# Patient Record
Sex: Female | Born: 1965 | Race: Black or African American | Hispanic: No
Health system: Southern US, Community
[De-identification: ages and names within clinical notes are randomized; demographics above are authoritative.]

## PROBLEM LIST (undated history)

## (undated) DIAGNOSIS — N809 Endometriosis, unspecified: Secondary | ICD-10-CM

## (undated) DIAGNOSIS — J45909 Unspecified asthma, uncomplicated: Secondary | ICD-10-CM

## (undated) DIAGNOSIS — Z9109 Other allergy status, other than to drugs and biological substances: Secondary | ICD-10-CM

## (undated) DIAGNOSIS — E7800 Pure hypercholesterolemia, unspecified: Secondary | ICD-10-CM

## (undated) HISTORY — DX: Unspecified asthma, uncomplicated: J45.909

## (undated) HISTORY — DX: Other allergy status, other than to drugs and biological substances: Z91.09

## (undated) HISTORY — PX: TONSILLECTOMY: SUR1361

## (undated) HISTORY — PX: KELOID EXCISION: SHX1856

## (undated) HISTORY — DX: Pure hypercholesterolemia, unspecified: E78.00

## (undated) HISTORY — DX: Endometriosis, unspecified: N80.9

---

## 1998-12-29 ENCOUNTER — Other Ambulatory Visit: Admission: RE | Admit: 1998-12-29 | Discharge: 1998-12-29 | Payer: Self-pay | Admitting: Obstetrics and Gynecology

## 1999-05-08 ENCOUNTER — Encounter: Payer: Self-pay | Admitting: Obstetrics and Gynecology

## 1999-05-08 ENCOUNTER — Ambulatory Visit (HOSPITAL_COMMUNITY): Admission: RE | Admit: 1999-05-08 | Discharge: 1999-05-08 | Payer: Self-pay | Admitting: Obstetrics and Gynecology

## 1999-06-02 ENCOUNTER — Encounter (INDEPENDENT_AMBULATORY_CARE_PROVIDER_SITE_OTHER): Payer: Self-pay

## 1999-06-02 ENCOUNTER — Ambulatory Visit (HOSPITAL_COMMUNITY): Admission: RE | Admit: 1999-06-02 | Discharge: 1999-06-02 | Payer: Self-pay | Admitting: Obstetrics and Gynecology

## 1999-11-05 ENCOUNTER — Encounter (INDEPENDENT_AMBULATORY_CARE_PROVIDER_SITE_OTHER): Payer: Self-pay

## 1999-11-05 ENCOUNTER — Other Ambulatory Visit: Admission: RE | Admit: 1999-11-05 | Discharge: 1999-11-05 | Payer: Self-pay | Admitting: *Deleted

## 1999-12-28 ENCOUNTER — Other Ambulatory Visit: Admission: RE | Admit: 1999-12-28 | Discharge: 1999-12-28 | Payer: Self-pay | Admitting: Obstetrics and Gynecology

## 2001-08-21 ENCOUNTER — Other Ambulatory Visit: Admission: RE | Admit: 2001-08-21 | Discharge: 2001-08-21 | Payer: Self-pay | Admitting: Obstetrics and Gynecology

## 2001-09-29 ENCOUNTER — Encounter: Payer: Self-pay | Admitting: Obstetrics and Gynecology

## 2001-09-29 ENCOUNTER — Ambulatory Visit (HOSPITAL_COMMUNITY): Admission: RE | Admit: 2001-09-29 | Discharge: 2001-09-29 | Payer: Self-pay | Admitting: Obstetrics and Gynecology

## 2002-08-22 ENCOUNTER — Other Ambulatory Visit: Admission: RE | Admit: 2002-08-22 | Discharge: 2002-08-22 | Payer: Self-pay | Admitting: Obstetrics and Gynecology

## 2004-01-14 ENCOUNTER — Other Ambulatory Visit: Admission: RE | Admit: 2004-01-14 | Discharge: 2004-01-14 | Payer: Self-pay | Admitting: Obstetrics and Gynecology

## 2005-01-12 ENCOUNTER — Other Ambulatory Visit: Admission: RE | Admit: 2005-01-12 | Discharge: 2005-01-12 | Payer: Self-pay | Admitting: Obstetrics and Gynecology

## 2006-01-20 ENCOUNTER — Other Ambulatory Visit: Admission: RE | Admit: 2006-01-20 | Discharge: 2006-01-20 | Payer: Self-pay | Admitting: Obstetrics and Gynecology

## 2006-01-25 ENCOUNTER — Ambulatory Visit (HOSPITAL_COMMUNITY): Admission: RE | Admit: 2006-01-25 | Discharge: 2006-01-25 | Payer: Self-pay | Admitting: Obstetrics and Gynecology

## 2007-01-30 ENCOUNTER — Ambulatory Visit (HOSPITAL_COMMUNITY): Admission: RE | Admit: 2007-01-30 | Discharge: 2007-01-30 | Payer: Self-pay | Admitting: Obstetrics and Gynecology

## 2008-02-01 ENCOUNTER — Ambulatory Visit (HOSPITAL_COMMUNITY): Admission: RE | Admit: 2008-02-01 | Discharge: 2008-02-01 | Payer: Self-pay | Admitting: Obstetrics and Gynecology

## 2009-02-04 ENCOUNTER — Ambulatory Visit (HOSPITAL_COMMUNITY): Admission: RE | Admit: 2009-02-04 | Discharge: 2009-02-04 | Payer: Self-pay | Admitting: Obstetrics and Gynecology

## 2009-09-16 ENCOUNTER — Encounter: Admission: RE | Admit: 2009-09-16 | Discharge: 2009-09-16 | Payer: Self-pay | Admitting: Family Medicine

## 2009-10-06 ENCOUNTER — Emergency Department (HOSPITAL_COMMUNITY): Admission: EM | Admit: 2009-10-06 | Discharge: 2009-10-06 | Payer: Self-pay | Admitting: Family Medicine

## 2009-10-06 ENCOUNTER — Emergency Department (HOSPITAL_COMMUNITY): Admission: EM | Admit: 2009-10-06 | Discharge: 2009-10-06 | Payer: Self-pay | Admitting: Emergency Medicine

## 2010-02-05 ENCOUNTER — Ambulatory Visit (HOSPITAL_COMMUNITY): Admission: RE | Admit: 2010-02-05 | Discharge: 2010-02-05 | Payer: Self-pay | Admitting: Obstetrics and Gynecology

## 2010-07-20 LAB — CBC
HCT: 41.2 % (ref 36.0–46.0)
MCV: 90.8 fL (ref 78.0–100.0)
RDW: 14.1 % (ref 11.5–15.5)

## 2010-07-20 LAB — DIFFERENTIAL
Basophils Absolute: 0 10*3/uL (ref 0.0–0.1)
Basophils Relative: 0 % (ref 0–1)
Eosinophils Absolute: 0.2 10*3/uL (ref 0.0–0.7)
Eosinophils Relative: 2 % (ref 0–5)
Lymphocytes Relative: 24 % (ref 12–46)
Lymphs Abs: 1.8 10*3/uL (ref 0.7–4.0)
Monocytes Relative: 5 % (ref 3–12)
Neutrophils Relative %: 68 % (ref 43–77)

## 2010-07-20 LAB — BASIC METABOLIC PANEL
CO2: 20 mEq/L (ref 19–32)
Calcium: 9.5 mg/dL (ref 8.4–10.5)
Creatinine, Ser: 0.89 mg/dL (ref 0.4–1.2)
GFR calc Af Amer: >60 mL/min (ref >60)
Glucose, Bld: 104 mg/dL — ABNORMAL HIGH (ref 70–99)
Potassium: 3.4 mEq/L — ABNORMAL LOW (ref 3.5–5.1)

## 2010-09-18 NOTE — Op Note (Signed)
[facility]  Patient:    Daisy Sawyer                   MRN: [id number] Proc. Date: 06/02/99 Adm. Date:  [id number] Attending:  Dierdre Forth [place]                           Operative Report  PREOPERATIVE DIAGNOSIS:       Persistent pelvic mass, dysmenorrhea, and pelvic pain.  POSTOPERATIVE DIAGNOSIS:      Left ovarian endometrioma.  Pelvic endometriosis.  Small uterine fibroid.  Pelvic adhesions.  OPERATION:                    Operative laparoscopy, left ovarian cystectomy, lysis of adhesions.  SURGEON:                      Vanessa P. Haygood, M.D.  ASSISTANT:  ANESTHESIA:                   General orotracheal.  ESTIMATED BLOOD LOSS:         Less than 50 cc.  COMPLICATIONS:                None.  FINDINGS:                     The uterus was upper limits of normal size with a 1 cm myoma on the posterior fundus.  The tubes were normal bilaterally.  The right ovary was without adhesions or evidence of endometriosis.  The left ovary contained a 2 cm endometrioma and was adherent to the left pelvic side wall by filmy adhesions.  The uterus was retroverted with filmy adhesions between the posterior uterus and the posterior cul-de-sac.  The anterior cul-de-sac contained a peritoneal window consistent with endometriosis.  The appendix appeared normal s did the liver edge.  DESCRIPTION OF PROCEDURE:     The patient was taken to the operating room after  appropriate identification and placed on the operating table.  After the attainment of adequate general anesthesia, she was placed in the modified lithotomy position. The abdomen, perineum, and vagina were prepped with multiple layers of Betadine and a Foley catheter inserted into the bladder and then connected to straight drainage. A single tooth tenaculum was placed on the anterior cervix.  The abdomen was draped as a sterile field.  The subumbilical and suprapubic areas  were infiltrated with a total of 10 cc of 0.25% Marcaine.  A subumbilical incision was made and the Veress cannula placed through that incision into the peritoneal cavity.  A pneumoperitoneum was created with 3 liters of CO2.  The laparoscopic trocar was  placed through that incision after removal of the Veress cannula and the laparoscope placed through the trocar sleeve.  Suprapubic incisions were made to the right and left of midline in the area that had been anesthetized and laparoscopic probe trocars placed through those incisions into the peritoneal cavity under direct visualization.  The above noted findings were made and documented.  The left ovary was then grasped and the cyst incised with the Harmonic scalpel with the release of a brown old blood material.  The internal surface of the cyst was investigated and no other contents found.  The cyst wall was then grasped with a self retaining instrument and pulled from the underlying ovarian  stroma.  The area that was  opened to allow egress of the fluid was then dissected off and also sent for pathologic evaluation.  Hemostasis was noted to be adequate. The filmy adhesions that held the posterior uterus into the cul-de-sac were all  lysed with the aid of the Harmonic scalpel and copious irrigation carried out.  Approximately 60 cc of warm Ringers lactate was left in the peritoneal cavity and all instruments were removed from the peritoneal cavity under direct visualization as the CO2 was allowed to escape.  The suprapubic and subumbilical incisions were closed with subcuticular sutures of 3-0 Vicryl and covered with sterile dressings. The single tooth tenaculum and Foley catheter were removed.  The patient was awakened from general anesthesia and taken to the recovery room in satisfactory  condition having tolerated the procedure well with sponge, needle, and instrument counts were correct. DD:  06/02/99 TD:   06/02/99 Job: [id number]

## 2011-01-15 ENCOUNTER — Other Ambulatory Visit (HOSPITAL_COMMUNITY): Payer: Self-pay | Admitting: Obstetrics and Gynecology

## 2011-01-15 DIAGNOSIS — Z1231 Encounter for screening mammogram for malignant neoplasm of breast: Secondary | ICD-10-CM

## 2011-02-11 ENCOUNTER — Ambulatory Visit (HOSPITAL_COMMUNITY)
Admission: RE | Admit: 2011-02-11 | Discharge: 2011-02-11 | Disposition: A | Discharge status: Home / Self Care | Payer: BC Managed Care – PPO | Source: Ambulatory Visit | Attending: Obstetrics and Gynecology | Admitting: Obstetrics and Gynecology

## 2011-02-11 DIAGNOSIS — Z1231 Encounter for screening mammogram for malignant neoplasm of breast: Secondary | ICD-10-CM | POA: Insufficient documentation

## 2012-01-04 ENCOUNTER — Other Ambulatory Visit: Payer: Self-pay | Admitting: Obstetrics and Gynecology

## 2012-01-04 DIAGNOSIS — Z1231 Encounter for screening mammogram for malignant neoplasm of breast: Secondary | ICD-10-CM

## 2012-02-17 ENCOUNTER — Ambulatory Visit (HOSPITAL_COMMUNITY): Payer: BC Managed Care – PPO

## 2012-02-24 ENCOUNTER — Ambulatory Visit (HOSPITAL_COMMUNITY)
Admission: RE | Admit: 2012-02-24 | Discharge: 2012-02-24 | Disposition: A | Discharge status: Home / Self Care | Payer: BC Managed Care – PPO | Source: Ambulatory Visit | Attending: Obstetrics and Gynecology | Admitting: Obstetrics and Gynecology

## 2012-02-24 DIAGNOSIS — Z1231 Encounter for screening mammogram for malignant neoplasm of breast: Secondary | ICD-10-CM | POA: Insufficient documentation

## 2012-02-29 ENCOUNTER — Telehealth: Payer: Self-pay | Admitting: Obstetrics and Gynecology

## 2012-03-01 ENCOUNTER — Other Ambulatory Visit: Payer: Self-pay | Admitting: Obstetrics and Gynecology

## 2012-03-01 DIAGNOSIS — N809 Endometriosis, unspecified: Secondary | ICD-10-CM

## 2012-03-01 MED ORDER — NORETHINDRONE ACETATE 5 MG PO TABS: 5.0000 mg | ORAL_TABLET | Freq: Three times a day (TID) | ORAL | Status: DC

## 2012-03-01 NOTE — Telephone Encounter (Signed)
Spoke with pt rgd rx refill request. Pt stated she needed a refill on rx. Pt made a aex for 03/11/2012 with [facility] . Approved rx for Aygestin 5 mg 3 times a day disp 126 with no refills. Pt aware of rx and voice understanding.

## 2012-04-10 ENCOUNTER — Encounter: Payer: Self-pay | Admitting: Obstetrics and Gynecology

## 2012-04-10 ENCOUNTER — Ambulatory Visit (INDEPENDENT_AMBULATORY_CARE_PROVIDER_SITE_OTHER): Payer: BC Managed Care – PPO | Admitting: Obstetrics and Gynecology

## 2012-04-10 VITALS — BP 120/80 | Resp 18 | Ht 70.0 in | Wt 182.0 lb

## 2012-04-10 DIAGNOSIS — Z8742 Personal history of other diseases of the female genital tract: Secondary | ICD-10-CM

## 2012-04-10 DIAGNOSIS — G8929 Other chronic pain: Secondary | ICD-10-CM | POA: Insufficient documentation

## 2012-04-10 DIAGNOSIS — N809 Endometriosis, unspecified: Secondary | ICD-10-CM

## 2012-04-10 DIAGNOSIS — R102 Pelvic and perineal pain: Secondary | ICD-10-CM

## 2012-04-10 DIAGNOSIS — N949 Unspecified condition associated with female genital organs and menstrual cycle: Secondary | ICD-10-CM

## 2012-04-10 MED ORDER — NORETHINDRONE ACETATE 5 MG PO TABS: 5.0000 mg | ORAL_TABLET | Freq: Three times a day (TID) | ORAL | Status: DC

## 2012-04-10 NOTE — Progress Notes (Signed)
AEX Last Pap: 03/15/11  WNL: Yes Neg HR HPV no hx abnl pap. Next screening due 2015 Regular Periods:no Contraception: none  Monthly Breast exam:yes Tetanus<80yrs:no Nl.Bladder Function:yes Daily BMs:no c/o constipation Healthy Diet:yes Calcium:yes Mammogram:yes  Date of Mammogram 02/24/12 Exercise:yes Have often Exercise: 3x's wk Seatbelt: yes Abuse at home: no Stressful work:yes Sigmoid-colonoscopy: >10 yrs ago WNL per pt  Bone Density: No PCP: Laurann Montana Change in PMH: no change Change in FMH:no change  Subjective:    Daisy Sawyer is a 46 y.o. female, G0P0000, who presents for an annual exam. She has been followed for many years for presumed endometriosis.  She underwent diagnostic laparoscopy in 2001 with visual findings consistent with endometriosis, but biopsy of ovarian cyst was not definitive.  The pt, however, has gotten good relief from severe dysmenorrhea and pelvic pain using continuous progesterone in the form of Aygestin.  Over the last year, she has been able to taper down to 10 mg daily with continued amenorrhea, and good control of pain.    History   Social History  . Marital Status: Single    Spouse Name: N/A    Number of Children: N/A  . Years of Education: N/A   Social History Main Topics  . Smoking status: Never Smoker   . Smokeless tobacco: Never Used  . Alcohol Use: No  . Drug Use: No  . Sexually Active: Not Currently    Birth Control/ Protection: None   Other Topics Concern  . None   Social History Narrative  . None    Menstrual cycle:   LMP: No LMP recorded.           Cycle: No menses on Aygestin.  Pt has now tapered down to 10 mg (2 tabs daily) with maintenance of amenorrhea The following portions of the patient's history were reviewed and updated as appropriate: allergies, current medications, past family history, past medical history, past social history, past surgical history and problem list.  Review of Systems Pertinent  items are noted in HPI. Breast:Negative for breast lump,nipple discharge or nipple retraction Gastrointestinal:BMs 2-3 weekly.  Occassional pain prior to BM.  All seem to be diet related.  No rectal bleeding Urinary:negative   Objective:    BP 120/80  Resp 18  Ht 5\' 10"  (1.778 m)  Wt 182 lb (82.555 kg)  BMI 26.11 kg/m2    Weight:  Wt Readings from Last 1 Encounters:  04/10/12 182 lb (82.555 kg)          BMI: Body mass index is 26.11 kg/(m^2).  General Appearance: Alert, appropriate appearance for age. No acute distress HEENT: Grossly normal Neck / Thyroid: Supple, no masses, nodes or enlargement Lungs: clear to auscultation bilaterally Back: No CVA tenderness Breast Exam: No masses or nodes.No dimpling, nipple retraction or discharge. Cardiovascular: Regular rate and rhythm. S1, S2, no murmur Gastrointestinal: Soft, non-tender, no masses or organomegaly Pelvic Exam: External genitalia: normal general appearance Vaginal: atrophic mucosa Cervix: normal appearance Adnexa: no masses noted Uterus: normal single, nontender Rectovaginal: normal rectal, no masses and no rectovaginal masses, no uterosacral nodules noted Lymphatic Exam: Non-palpable nodes in neck, clavicular, axillary, or inguinal regions  Skin: no rash or abnormalities Neurologic: Normal gait and speech, no tremor  Psychiatric: Alert and oriented, appropriate affect.   Wet Prep:not applicable Urinalysis:not applicable UPT: Not done   Assessment:    Endometriosis with symptomatic control with continuous progewsterone    Plan:    mammogram pap smear return annually or prn  Reviewed options for management of endometriosis again: Observation only         Hysterectomy plus or minus oophorectomy         Lupron         Levonorgestrol containing IUD Pt opts to continue Aygestin STD screening: declined Contraception:no method, abstinence   Dierdre Forth MD

## 2012-07-17 ENCOUNTER — Telehealth: Payer: Self-pay | Admitting: Obstetrics and Gynecology

## 2012-07-17 DIAGNOSIS — N809 Endometriosis, unspecified: Secondary | ICD-10-CM

## 2012-07-17 MED ORDER — NORETHINDRONE ACETATE 5 MG PO TABS: 5.0000 mg | ORAL_TABLET | Freq: Three times a day (TID) | ORAL | Status: AC

## 2012-07-17 NOTE — Telephone Encounter (Signed)
Tc to pt per rx for Norethindrone e-pres to pharm on file. Pt agrees.

## 2013-01-31 ENCOUNTER — Other Ambulatory Visit: Payer: Self-pay | Admitting: Obstetrics and Gynecology

## 2013-01-31 DIAGNOSIS — Z1231 Encounter for screening mammogram for malignant neoplasm of breast: Secondary | ICD-10-CM

## 2013-02-27 ENCOUNTER — Ambulatory Visit (HOSPITAL_COMMUNITY)
Admission: RE | Admit: 2013-02-27 | Discharge: 2013-02-27 | Disposition: A | Discharge status: Home / Self Care | Payer: BC Managed Care – PPO | Source: Ambulatory Visit | Attending: Obstetrics and Gynecology | Admitting: Obstetrics and Gynecology

## 2013-02-27 DIAGNOSIS — Z1231 Encounter for screening mammogram for malignant neoplasm of breast: Secondary | ICD-10-CM | POA: Insufficient documentation

## 2014-01-30 ENCOUNTER — Other Ambulatory Visit (HOSPITAL_COMMUNITY): Payer: Self-pay | Admitting: Obstetrics and Gynecology

## 2014-01-30 DIAGNOSIS — Z1231 Encounter for screening mammogram for malignant neoplasm of breast: Secondary | ICD-10-CM

## 2014-03-07 ENCOUNTER — Ambulatory Visit (HOSPITAL_COMMUNITY)
Admission: RE | Admit: 2014-03-07 | Discharge: 2014-03-07 | Disposition: A | Discharge status: Home / Self Care | Payer: BC Managed Care – PPO | Source: Ambulatory Visit | Attending: Obstetrics and Gynecology | Admitting: Obstetrics and Gynecology

## 2014-03-07 DIAGNOSIS — Z1231 Encounter for screening mammogram for malignant neoplasm of breast: Secondary | ICD-10-CM | POA: Insufficient documentation

## 2014-09-26 ENCOUNTER — Encounter: Payer: Self-pay | Admitting: *Deleted

## 2015-02-06 ENCOUNTER — Other Ambulatory Visit: Payer: Self-pay

## 2015-02-06 DIAGNOSIS — Z1231 Encounter for screening mammogram for malignant neoplasm of breast: Secondary | ICD-10-CM

## 2015-03-17 ENCOUNTER — Ambulatory Visit
Admission: RE | Admit: 2015-03-17 | Discharge: 2015-03-17 | Disposition: A | Discharge status: Home / Self Care | Payer: BC Managed Care – PPO | Source: Ambulatory Visit

## 2015-03-17 DIAGNOSIS — Z1231 Encounter for screening mammogram for malignant neoplasm of breast: Secondary | ICD-10-CM

## 2016-02-27 ENCOUNTER — Other Ambulatory Visit: Payer: Self-pay | Admitting: Obstetrics and Gynecology

## 2016-02-27 DIAGNOSIS — Z1231 Encounter for screening mammogram for malignant neoplasm of breast: Secondary | ICD-10-CM

## 2016-03-19 ENCOUNTER — Ambulatory Visit
Admission: RE | Admit: 2016-03-19 | Discharge: 2016-03-19 | Disposition: A | Discharge status: Home / Self Care | Payer: BC Managed Care – PPO | Source: Ambulatory Visit | Attending: Obstetrics and Gynecology | Admitting: Obstetrics and Gynecology

## 2016-03-19 DIAGNOSIS — Z1231 Encounter for screening mammogram for malignant neoplasm of breast: Secondary | ICD-10-CM

## 2016-12-13 ENCOUNTER — Ambulatory Visit
Admission: RE | Admit: 2016-12-13 | Discharge: 2016-12-13 | Disposition: A | Discharge status: Home / Self Care | Payer: BC Managed Care – PPO | Source: Ambulatory Visit | Attending: Family Medicine | Admitting: Family Medicine

## 2016-12-13 ENCOUNTER — Other Ambulatory Visit: Payer: Self-pay | Admitting: Family Medicine

## 2016-12-13 DIAGNOSIS — M940 Chondrocostal junction syndrome [Tietze]: Secondary | ICD-10-CM

## 2017-02-28 ENCOUNTER — Other Ambulatory Visit: Payer: Self-pay | Admitting: Obstetrics and Gynecology

## 2017-02-28 DIAGNOSIS — Z139 Encounter for screening, unspecified: Secondary | ICD-10-CM

## 2017-03-23 ENCOUNTER — Ambulatory Visit
Admission: RE | Admit: 2017-03-23 | Discharge: 2017-03-23 | Disposition: A | Discharge status: Home / Self Care | Payer: BC Managed Care – PPO | Source: Ambulatory Visit | Attending: Obstetrics and Gynecology | Admitting: Obstetrics and Gynecology

## 2017-03-23 DIAGNOSIS — Z139 Encounter for screening, unspecified: Secondary | ICD-10-CM

## 2018-01-05 ENCOUNTER — Other Ambulatory Visit: Payer: Self-pay | Admitting: Family Medicine

## 2018-01-05 ENCOUNTER — Ambulatory Visit
Admission: RE | Admit: 2018-01-05 | Discharge: 2018-01-05 | Disposition: A | Discharge status: Home / Self Care | Payer: BC Managed Care – PPO | Source: Ambulatory Visit | Attending: Family Medicine | Admitting: Family Medicine

## 2018-01-05 DIAGNOSIS — M940 Chondrocostal junction syndrome [Tietze]: Secondary | ICD-10-CM

## 2018-01-16 ENCOUNTER — Ambulatory Visit
Admission: RE | Admit: 2018-01-16 | Discharge: 2018-01-16 | Disposition: A | Discharge status: Home / Self Care | Payer: BC Managed Care – PPO | Source: Ambulatory Visit | Attending: Orthopedic Surgery | Admitting: Orthopedic Surgery

## 2018-01-16 ENCOUNTER — Other Ambulatory Visit: Payer: Self-pay | Admitting: Orthopedic Surgery

## 2018-01-16 DIAGNOSIS — R079 Chest pain, unspecified: Secondary | ICD-10-CM

## 2018-02-08 ENCOUNTER — Other Ambulatory Visit: Payer: Self-pay | Admitting: *Deleted

## 2018-02-08 DIAGNOSIS — N390 Urinary tract infection, site not specified: Secondary | ICD-10-CM | POA: Insufficient documentation

## 2018-02-08 DIAGNOSIS — J45909 Unspecified asthma, uncomplicated: Secondary | ICD-10-CM | POA: Insufficient documentation

## 2018-02-08 DIAGNOSIS — N6019 Diffuse cystic mastopathy of unspecified breast: Secondary | ICD-10-CM | POA: Insufficient documentation

## 2018-02-08 DIAGNOSIS — L659 Nonscarring hair loss, unspecified: Secondary | ICD-10-CM | POA: Insufficient documentation

## 2018-02-08 DIAGNOSIS — D259 Leiomyoma of uterus, unspecified: Secondary | ICD-10-CM | POA: Insufficient documentation

## 2018-02-08 DIAGNOSIS — N809 Endometriosis, unspecified: Secondary | ICD-10-CM | POA: Insufficient documentation

## 2018-02-08 DIAGNOSIS — N952 Postmenopausal atrophic vaginitis: Secondary | ICD-10-CM | POA: Insufficient documentation

## 2018-02-21 NOTE — Progress Notes (Signed)
[facility] [address], [place] [address] Phone: [phone] Subjective:    I Kandace Blitz am serving as a Education administrator for Dr. Hulan Saas.    CC: Chest wall pain  NTI:[id number]  Daisy Sawyer is a 52 y.o. female coming in with complaint of costocondritis. Has seen multiple doctors. Painful with breathing. Has asthma. Has been prescribed bed rest.  Been on 3 rounds of prednisone that does not seem to help the pain significantly either.  Onset- Chronic Location- Right sided  Character- throbbing "headache in my chest" Aggravating factors- activity, walking Reliving factors- heat, magnesium, natural antiinflamatory Severity-5 out of 10   Patient has had work-up including a CT of the chest done that did not show any type of pulmonary pathology that could be contributing to her discomfort pain at this time.  This was independently visualized by me.  Past Medical History:  Diagnosis Date  . Asthma   . Endometriosis   . Environmental allergies   . Hypercholesteremia    Past Surgical History:  Procedure Laterality Date  . KELOID EXCISION    . TONSILLECTOMY     Social History   Socioeconomic History  . Marital status: Single    Spouse name: Not on file  . Number of children: Not on file  . Years of education: Not on file  . Highest education level: Not on file  Occupational History  . Occupation: Electrical engineer  Social Needs  . Financial resource strain: Not on file  . Food insecurity:    Worry: Not on file    Inability: Not on file  . Transportation needs:    Medical: Not on file    Non-medical: Not on file  Tobacco Use  . Smoking status: Never Smoker  . Smokeless tobacco: Never Used  Substance and Sexual Activity  . Alcohol use: No  . Drug use: No  . Sexual activity: Not Currently    Birth control/protection: None  Lifestyle  . Physical activity:    Days per week: Not on file    Minutes per session: Not on file  .  Stress: Not on file  Relationships  . Social connections:    Talks on phone: Not on file    Gets together: Not on file    Attends religious service: Not on file    Active member of club or organization: Not on file    Attends meetings of clubs or organizations: Not on file    Relationship status: Not on file  Other Topics Concern  . Not on file  Social History Narrative  . Not on file   Allergies  Allergen Reactions  . Anaprox [Naproxen] Itching and Swelling  . Asa [Aspirin] Swelling  . Ibuprofen Itching and Swelling  . Other Itching and Swelling    Darvocet-N   . Sulindac Itching and Swelling   Family History  Problem Relation Age of Onset  . Hypertension Father   . Skin cancer Father   . Hypertension Mother   . Hypertension Sister   . Diabetes Sister   . Congestive Heart Failure Sister   . CAD Sister   . Breast cancer Maternal Aunt   . Breast cancer Cousin        in 50's    Current Outpatient Medications (Endocrine & Metabolic):  .  norethindrone (AYGESTIN) 5 MG tablet, Take 1 tablet (5 mg total) by mouth 3 (three) times daily.  Current Outpatient Medications (Cardiovascular):  .  amLODipine (NORVASC) 5 MG tablet,  .  rosuvastatin (CRESTOR) 5 MG tablet, TAKE 1 TABLET BY MOUTH TWICE A WEEK FOR 30 DAYS .  triamterene-hydrochlorothiazide (MAXZIDE-25) 37.5-25 MG tablet, TAKE 1 TABLET BY MOUTH ONCE DAILY IN THE MORNING FOR 30 DAYS  Current Outpatient Medications (Respiratory):  .  albuterol (PROAIR HFA) 108 (90 Base) MCG/ACT inhaler, ProAir HFA 90 mcg/actuation aerosol inhaler  Current Outpatient Medications (Analgesics):  .  HYDROcodone-acetaminophen (NORCO/VICODIN) 5-325 MG tablet, hydrocodone 5 mg-acetaminophen 325 mg tablet   Current Outpatient Medications (Other):  Marland Kitchen  CALCIUM PO, Take by mouth daily. .  cyclobenzaprine (FLEXERIL) 10 MG tablet, cyclobenzaprine 10 mg tablet .  MULTIPLE VITAMIN PO, Take by mouth. Marland Kitchen  PRESCRIPTION MEDICATION,  .  acyclovir  (ZOVIRAX) 400 MG tablet, Take 1 tablet (400 mg total) by mouth 3 (three) times daily. Marland Kitchen  esomeprazole (NEXIUM) 40 MG capsule, Take 1 capsule (40 mg total) by mouth daily.    Past medical history, social, surgical and family history all reviewed in electronic medical record.  No pertanent information unless stated regarding to the chief complaint.   Review of Systems:  No headache, visual changes, nausea, vomiting, diarrhea, constipation, dizziness, abdominal pain, skin rash, fevers, chills, night swe chest pain, shortness of breath, mood changes.  Positive muscle aches  Objective  Blood pressure 132/82, pulse 74, height 5\' 10"  (1.778 m), weight 191 lb (86.6 kg), SpO2 98 %.    General: No apparent distress alert and oriented x3 mood and affect normal, dressed appropriately.  HEENT: Pupils equal, extraocular movements intact  Respiratory: Patient's speak in full sentences and does not appear short of breath  Cardiovascular: No lower extremity edema, non tender, no erythema  Skin: Warm dry intact with no signs of infection or rash on extremities or on axial skeleton.  Abdomen: Soft nontender  Neuro: Cranial nerves II through XII are intact, neurovascularly intact in all extremities with 2+ DTRs and 2+ pulses.  Lymph: No lymphadenopathy of posterior or anterior cervical chain or axillae bilaterally.  Gait normal with good balance and coordination.  MSK:  Non tender with full range of motion and good stability and symmetric strength and tone of shoulders, elbows, wrist, hip, knee and ankles bilaterally.  Chest movement severe pain.  Patient seems to be more lateral.  Does seem to follow the dermatome T5 area.  Tender to light palpation but there is a potential fullness of the breast tissue.  Seems very swollen to palpation.  Seems to be going towards the pectoralis  Limited musculoskeletal ultrasound was performed and interpreted by Lyndal Pulley  Limited ultrasound shows that patient has  some mild increase in Doppler flow and some of the fatty deposits in the 10 o'clock position of the breast tissue.  No true mass appreciated though.  Patient's sternoclavicular joints are unremarkable and no true bone breakdown noted.    Impression and Recommendations:     This case required medical decision making of moderate complexity. The above documentation has been reviewed and is accurate and complete Lyndal Pulley, DO       Note: This dictation was prepared with Dragon dictation along with smaller phrase technology. Any transcriptional errors that result from this process are unintentional.

## 2018-02-22 ENCOUNTER — Other Ambulatory Visit: Payer: Self-pay | Admitting: Obstetrics and Gynecology

## 2018-02-22 ENCOUNTER — Encounter: Payer: Self-pay | Admitting: Family Medicine

## 2018-02-22 ENCOUNTER — Ambulatory Visit: Payer: Self-pay

## 2018-02-22 ENCOUNTER — Ambulatory Visit (INDEPENDENT_AMBULATORY_CARE_PROVIDER_SITE_OTHER): Payer: BC Managed Care – PPO | Admitting: Family Medicine

## 2018-02-22 VITALS — BP 132/82 | HR 74 | Ht 70.0 in | Wt 191.0 lb

## 2018-02-22 DIAGNOSIS — Z1231 Encounter for screening mammogram for malignant neoplasm of breast: Secondary | ICD-10-CM

## 2018-02-22 DIAGNOSIS — R079 Chest pain, unspecified: Secondary | ICD-10-CM

## 2018-02-22 DIAGNOSIS — R0789 Other chest pain: Secondary | ICD-10-CM | POA: Insufficient documentation

## 2018-02-22 MED ORDER — ACYCLOVIR 400 MG PO TABS: 400.0000 mg | ORAL_TABLET | Freq: Three times a day (TID) | ORAL | 0 refills | Status: DC

## 2018-02-22 MED ORDER — ESOMEPRAZOLE MAGNESIUM 40 MG PO CPDR: 40.0000 mg | DELAYED_RELEASE_CAPSULE | Freq: Every day | ORAL | 3 refills | Status: DC

## 2018-02-22 NOTE — Assessment & Plan Note (Signed)
Very significant differential at this time.  Strong family history of unfortunately breast cancer.  Patient has had fibrocystic changes of the breast and I did encourage patient to get the mammogram with patient having an area that is fairly tender.  We did discuss with her though that tenderness in this area would be a good sign.  We discussed the differential being the possibility of a nerve irritation that could be even potentially early shingles.  Likely patient has not had a rash but is having pain to even light palpation.  Discussed acyclovir.  Patient also could have a potential reflux.  Patient has been prescribed Prilosec previously but has only needs it as needed.  We discussed doing an daily for 2 weeks and given Nexium.  See if that makes a difference.  Possible gastro-intestinal work-up may be necessary as well.  Hopefully patient will get the mammogram in the near future and we did order this.  Patient will follow-up me in 2 to 3 weeks as a how she is responding.

## 2018-02-22 NOTE — Patient Instructions (Addendum)
Good ot see you  We will get a mammogram sooner, call and make an appointment if you have the number or they will call you  I Want you to take Nexium daily for at least 2 weeks Acyclovir 3 times a day for a week  See me again in 2-3 weeks to make sure all the way better

## 2018-03-10 NOTE — Progress Notes (Signed)
[facility] [address], [place] [address] Phone: [phone] Subjective:   Daisy Sawyer, am serving as a scribe for Dr. Hulan Saas.   CC: Chest pain   [id number]  Daisy Sawyer is a 52 y.o. female coming in with complaint of chest pain. She said that she is using the medicine and this helped her pain to become intermittent. Good days and bad days. Pain in the right side of ribs with inspiration and with cold weather.      Past Medical History:  Diagnosis Date  . Asthma   . Endometriosis   . Environmental allergies   . Hypercholesteremia    Past Surgical History:  Procedure Laterality Date  . KELOID EXCISION    . TONSILLECTOMY     Social History   Socioeconomic History  . Marital status: Single    Spouse name: Not on file  . Number of children: Not on file  . Years of education: Not on file  . Highest education level: Not on file  Occupational History  . Occupation: Electrical engineer  Social Needs  . Financial resource strain: Not on file  . Food insecurity:    Worry: Not on file    Inability: Not on file  . Transportation needs:    Medical: Not on file    Non-medical: Not on file  Tobacco Use  . Smoking status: Never Smoker  . Smokeless tobacco: Never Used  Substance and Sexual Activity  . Alcohol use: Sawyer  . Drug use: Sawyer  . Sexual activity: Not Currently    Birth control/protection: None  Lifestyle  . Physical activity:    Days per week: Not on file    Minutes per session: Not on file  . Stress: Not on file  Relationships  . Social connections:    Talks on phone: Not on file    Gets together: Not on file    Attends religious service: Not on file    Active member of club or organization: Not on file    Attends meetings of clubs or organizations: Not on file    Relationship status: Not on file  Other Topics Concern  . Not on file  Social History Narrative  . Not on file   Allergies  Allergen  Reactions  . Anaprox [Naproxen] Itching and Swelling  . Asa [Aspirin] Swelling  . Ibuprofen Itching and Swelling  . Other Itching and Swelling    Darvocet-N   . Sulindac Itching and Swelling   Family History  Problem Relation Age of Onset  . Hypertension Father   . Skin cancer Father   . Hypertension Mother   . Hypertension Sister   . Diabetes Sister   . Congestive Heart Failure Sister   . CAD Sister   . Breast cancer Maternal Aunt   . Breast cancer Cousin        in 50's    Current Outpatient Medications (Endocrine & Metabolic):  .  norethindrone (AYGESTIN) 5 MG tablet, Take 1 tablet (5 mg total) by mouth 3 (three) times daily.  Current Outpatient Medications (Cardiovascular):  .  amLODipine (NORVASC) 5 MG tablet,  .  rosuvastatin (CRESTOR) 5 MG tablet, TAKE 1 TABLET BY MOUTH TWICE A WEEK FOR 30 DAYS .  triamterene-hydrochlorothiazide (MAXZIDE-25) 37.5-25 MG tablet, TAKE 1 TABLET BY MOUTH ONCE DAILY IN THE MORNING FOR 30 DAYS  Current Outpatient Medications (Respiratory):  .  albuterol (PROAIR HFA) 108 (90 Base) MCG/ACT inhaler, ProAir  HFA 90 mcg/actuation aerosol inhaler  Current Outpatient Medications (Analgesics):  .  HYDROcodone-acetaminophen (NORCO/VICODIN) 5-325 MG tablet, hydrocodone 5 mg-acetaminophen 325 mg tablet   Current Outpatient Medications (Other):  .  acyclovir (ZOVIRAX) 400 MG tablet, Take 1 tablet (400 mg total) by mouth 3 (three) times daily. Marland Kitchen  CALCIUM PO, Take by mouth daily. .  cyclobenzaprine (FLEXERIL) 10 MG tablet, cyclobenzaprine 10 mg tablet .  esomeprazole (NEXIUM) 40 MG capsule, Take 1 capsule (40 mg total) by mouth daily. .  MULTIPLE VITAMIN PO, Take by mouth. Marland Kitchen  PRESCRIPTION MEDICATION,     Past medical history, social, surgical and family history all reviewed in electronic medical record.  Sawyer pertanent information unless stated regarding to the chief complaint.   Review of Systems:  Sawyer headache, visual changes, nausea, vomiting,  diarrhea, constipation, dizziness, abdominal pain, skin rash, fevers, chills, night sweats, weight loss, swollen lymph nodes, body aches, joint swelling,, chest pain, shortness of breath, mood changes.  Positive muscle aches  Objective  Blood pressure 128/88, pulse 85, height 5\' 10"  (1.778 m), weight 190 lb (86.2 kg), SpO2 98 %.    General: Sawyer apparent distress alert and oriented x3 mood and affect normal, dressed appropriately.  HEENT: Pupils equal, extraocular movements intact  Respiratory: Patient's speak in full sentences and does not appear short of breath  Cardiovascular: Sawyer lower extremity edema, non tender, Sawyer erythema  Skin: Warm dry intact with Sawyer signs of infection or rash on extremities or on axial skeleton.  Abdomen: Soft nontender  Neuro: Cranial nerves II through XII are intact, neurovascularly intact in all extremities with 2+ DTRs and 2+ pulses.  Lymph: Sawyer lymphadenopathy of posterior or anterior cervical chain or axillae bilaterally.  Gait normal with good balance and coordination.  MSK:  Non tender with full range of motion and good stability and symmetric strength and tone of shoulders, elbows, wrist, hip, knee and ankles bilaterally.  Very mild arthritic changes Right-sided chest pain still has some fibrocystic changes of the breast.  Severely tender to palpation.  Sawyer skin changes in the area.      Impression and Recommendations:     The above documentation has been reviewed and is accurate and complete Lyndal Pulley, DO       Note: This dictation was prepared with Dragon dictation along with smaller phrase technology. Any transcriptional errors that result from this process are unintentional.

## 2018-03-13 ENCOUNTER — Encounter: Payer: Self-pay | Admitting: Family Medicine

## 2018-03-13 ENCOUNTER — Ambulatory Visit (INDEPENDENT_AMBULATORY_CARE_PROVIDER_SITE_OTHER): Payer: BC Managed Care – PPO | Admitting: Family Medicine

## 2018-03-13 ENCOUNTER — Other Ambulatory Visit (INDEPENDENT_AMBULATORY_CARE_PROVIDER_SITE_OTHER): Payer: BC Managed Care – PPO

## 2018-03-13 VITALS — BP 128/88 | HR 85 | Ht 70.0 in | Wt 190.0 lb

## 2018-03-13 DIAGNOSIS — R0789 Other chest pain: Secondary | ICD-10-CM

## 2018-03-13 DIAGNOSIS — M2550 Pain in unspecified joint: Secondary | ICD-10-CM

## 2018-03-13 LAB — IBC PANEL
Iron: 97 ug/dL (ref 42–145)
SATURATION RATIOS: 26.4 % (ref 20.0–50.0)
Transferrin: 262 mg/dL (ref 212.0–360.0)

## 2018-03-13 LAB — VITAMIN D 25 HYDROXY (VIT D DEFICIENCY, FRACTURES): VITD: 39.46 ng/mL (ref 30.00–100.00)

## 2018-03-13 LAB — URIC ACID: URIC ACID, SERUM: 4.8 mg/dL (ref 2.4–7.0)

## 2018-03-13 LAB — C-REACTIVE PROTEIN: CRP: 0.8 mg/dL (ref 0.5–20.0)

## 2018-03-13 LAB — FERRITIN: Ferritin: 283.3 ng/mL (ref 10.0–291.0)

## 2018-03-13 LAB — SEDIMENTATION RATE: SED RATE: 33 mm/h — AB (ref 0–30)

## 2018-03-13 NOTE — Assessment & Plan Note (Signed)
Patient did respond somewhat to the acyclovir but no true rash are appreciated.  Has been on prednisone multiple times.  Does have significant amount of asthma and does have some scarring of the lungs.  Discussed using the albuterol before activity as well as given a trial of Qvar.  Laboratory work-up for any inflammatory markers.  Encourage patient to follow through with her mammogram.  Follow-up with me again in 1 to 2 weeks after that to further assess.

## 2018-03-13 NOTE — Patient Instructions (Signed)
Good to see you  Try the inhaler I gave you daily  Also use the red inhaler 30 minutes before working out and lets see if that helps Get labs downstairs today to check for inflammatory markers Get the mamogram  See me about 1 week after the mamogram and everything else and lets see how you re doing

## 2018-03-14 LAB — PTH, INTACT AND CALCIUM
Calcium: 10.4 mg/dL (ref 8.6–10.4)
PTH: 22 pg/mL (ref 14–64)

## 2018-03-14 LAB — RHEUMATOID FACTOR: Rhuematoid fact SerPl-aCnc: <14 IU/mL (ref <14)

## 2018-03-14 LAB — CALCIUM, IONIZED: Calcium, Ion: 5.47 mg/dL (ref 4.8–5.6)

## 2018-03-14 LAB — ANA: ANA: NEGATIVE

## 2018-04-03 ENCOUNTER — Ambulatory Visit
Admission: RE | Admit: 2018-04-03 | Discharge: 2018-04-03 | Disposition: A | Discharge status: Home / Self Care | Payer: BC Managed Care – PPO | Source: Ambulatory Visit | Attending: Obstetrics and Gynecology | Admitting: Obstetrics and Gynecology

## 2018-04-03 DIAGNOSIS — Z1231 Encounter for screening mammogram for malignant neoplasm of breast: Secondary | ICD-10-CM

## 2018-04-05 ENCOUNTER — Other Ambulatory Visit: Payer: Self-pay | Admitting: Obstetrics and Gynecology

## 2018-04-05 DIAGNOSIS — R928 Other abnormal and inconclusive findings on diagnostic imaging of breast: Secondary | ICD-10-CM

## 2018-04-10 ENCOUNTER — Other Ambulatory Visit: Payer: Self-pay | Admitting: Obstetrics and Gynecology

## 2018-04-10 ENCOUNTER — Ambulatory Visit
Admission: RE | Admit: 2018-04-10 | Discharge: 2018-04-10 | Disposition: A | Discharge status: Home / Self Care | Payer: BC Managed Care – PPO | Source: Ambulatory Visit | Attending: Obstetrics and Gynecology | Admitting: Obstetrics and Gynecology

## 2018-04-10 DIAGNOSIS — R928 Other abnormal and inconclusive findings on diagnostic imaging of breast: Secondary | ICD-10-CM

## 2018-04-10 DIAGNOSIS — R921 Mammographic calcification found on diagnostic imaging of breast: Secondary | ICD-10-CM

## 2018-04-19 NOTE — Progress Notes (Signed)
[facility] [place], [place] [address] Phone: [phone] Subjective:    I Daisy Sawyer am serving as a Education administrator for Dr. Hulan Saas.   CC: Right-sided chest pain follow-up  [id number]  Daisy Sawyer is a 52 y.o. female coming in with complaint of polyarthralgia/ chest pain. States that she is still having some discomfort. Had the flu last week. Weather change causes aches. Wants to know results mammogram. Was told there was a small spot on her left side but was told to come back in 6 months. Achy down the shoulder. Would like to try a topical.   Patient was sent for an mammogram.  Patient's right side had some fibrocystic changes but nothing severe.  No signs of any mass.  Patient did have a calcific nodule noted of 1 mm to 2 mm bigger on the left side with encouragement for her to have a repeat mammogram in 6 months.  Patient states that when she had the flu 1 week ago when she was in bed she did not have as much pain.  Now with her starting to increase range of motion and and doing more activity she has noticing more discomfort.  Did do the trial of the albuterol and did find that that was somewhat helpful.     Past Medical History:  Diagnosis Date  . Asthma   . Endometriosis   . Environmental allergies   . Hypercholesteremia    Past Surgical History:  Procedure Laterality Date  . KELOID EXCISION    . TONSILLECTOMY     Social History   Socioeconomic History  . Marital status: Single    Spouse name: Not on file  . Number of children: Not on file  . Years of education: Not on file  . Highest education level: Not on file  Occupational History  . Occupation: Electrical engineer  Social Needs  . Financial resource strain: Not on file  . Food insecurity:    Worry: Not on file    Inability: Not on file  . Transportation needs:    Medical: Not on file    Non-medical: Not on file  Tobacco Use  . Smoking status: Never  Smoker  . Smokeless tobacco: Never Used  Substance and Sexual Activity  . Alcohol use: No  . Drug use: No  . Sexual activity: Not Currently    Birth control/protection: None  Lifestyle  . Physical activity:    Days per week: Not on file    Minutes per session: Not on file  . Stress: Not on file  Relationships  . Social connections:    Talks on phone: Not on file    Gets together: Not on file    Attends religious service: Not on file    Active member of club or organization: Not on file    Attends meetings of clubs or organizations: Not on file    Relationship status: Not on file  Other Topics Concern  . Not on file  Social History Narrative  . Not on file   Allergies  Allergen Reactions  . Anaprox [Naproxen] Itching and Swelling  . Asa [Aspirin] Swelling  . Ibuprofen Itching and Swelling  . Other Itching and Swelling    Darvocet-N   . Sulindac Itching and Swelling   Family History  Problem Relation Age of Onset  . Hypertension Father   . Skin cancer Father   . Hypertension Mother   . Hypertension Sister   .  Diabetes Sister   . Congestive Heart Failure Sister   . CAD Sister   . Breast cancer Maternal Aunt   . Breast cancer Cousin        in 50's    Current Outpatient Medications (Endocrine & Metabolic):  .  norethindrone (AYGESTIN) 5 MG tablet, Take 1 tablet (5 mg total) by mouth 3 (three) times daily.  Current Outpatient Medications (Cardiovascular):  .  amLODipine (NORVASC) 5 MG tablet,  .  rosuvastatin (CRESTOR) 5 MG tablet, TAKE 1 TABLET BY MOUTH TWICE A WEEK FOR 30 DAYS .  triamterene-hydrochlorothiazide (MAXZIDE-25) 37.5-25 MG tablet, TAKE 1 TABLET BY MOUTH ONCE DAILY IN THE MORNING FOR 30 DAYS  Current Outpatient Medications (Respiratory):  .  albuterol (PROAIR HFA) 108 (90 Base) MCG/ACT inhaler, ProAir HFA 90 mcg/actuation aerosol inhaler  Current Outpatient Medications (Analgesics):  .  HYDROcodone-acetaminophen (NORCO/VICODIN) 5-325 MG tablet,  hydrocodone 5 mg-acetaminophen 325 mg tablet   Current Outpatient Medications (Other):  .  acyclovir (ZOVIRAX) 400 MG tablet, Take 1 tablet (400 mg total) by mouth 3 (three) times daily. Marland Kitchen  CALCIUM PO, Take by mouth daily. .  cyclobenzaprine (FLEXERIL) 10 MG tablet, cyclobenzaprine 10 mg tablet .  esomeprazole (NEXIUM) 40 MG capsule, Take 1 capsule (40 mg total) by mouth daily. .  MULTIPLE VITAMIN PO, Take by mouth. Marland Kitchen  PRESCRIPTION MEDICATION,  .  Vitamin D, Ergocalciferol, (DRISDOL) 1.25 MG (50000 UT) CAPS capsule, Take 1 capsule (50,000 Units total) by mouth every 7 (seven) days.    Past medical history, social, surgical and family history all reviewed in electronic medical record.  No pertanent information unless stated regarding to the chief complaint.   Review of Systems:  No headache, visual changes, nausea, vomiting, diarrhea, constipation, dizziness, abdominal pain, skin rash, fevers, chills, night sweats, weight loss, swollen lymph nodes, body aches, joint swelling, , chest pain, shortness of breath, mood changes.  Mild positive muscle aches  Objective  Blood pressure 130/84, pulse 72, height 5\' 10"  (1.778 m), weight 190 lb (86.2 kg), SpO2 98 %.   General: No apparent distress alert and oriented x3 mood and affect normal, dressed appropriately.  HEENT: Pupils equal, extraocular movements intact  Respiratory: Patient's speak in full sentences and does not appear short of breath  Cardiovascular: No lower extremity edema, non tender, no erythema  Skin: Warm dry intact with no signs of infection or rash on extremities or on axial skeleton.  Abdomen: Soft nontender  Neuro: Cranial nerves II through XII are intact, neurovascularly intact in all extremities with 2+ DTRs and 2+ pulses.  Lymph: No lymphadenopathy of posterior or anterior cervical chain or axillae bilaterally.  Gait normal with good balance and coordination.  MSK:  Non tender with full range of motion and good stability  and symmetric strength and tone of shoulders, elbows, wrist, hip, knee and ankles bilaterally.  Chest exam shows the patient does have full range of motion of the shoulder.  Still tender to palpation more in the soft tissue than anything else.  Patient does have some mild discomfort when taking a deep breath.   Impression and Recommendations:      The above documentation has been reviewed and is accurate and complete Lyndal Pulley, DO       Note: This dictation was prepared with Dragon dictation along with smaller phrase technology. Any transcriptional errors that result from this process are unintentional.

## 2018-04-20 ENCOUNTER — Encounter: Payer: Self-pay | Admitting: Family Medicine

## 2018-04-20 ENCOUNTER — Ambulatory Visit (INDEPENDENT_AMBULATORY_CARE_PROVIDER_SITE_OTHER): Payer: BC Managed Care – PPO | Admitting: Family Medicine

## 2018-04-20 VITALS — BP 130/84 | HR 72 | Ht 70.0 in | Wt 190.0 lb

## 2018-04-20 DIAGNOSIS — N6011 Diffuse cystic mastopathy of right breast: Secondary | ICD-10-CM

## 2018-04-20 DIAGNOSIS — R079 Chest pain, unspecified: Secondary | ICD-10-CM | POA: Diagnosis not present

## 2018-04-20 MED ORDER — VITAMIN D (ERGOCALCIFEROL) 1.25 MG (50000 UNIT) PO CAPS: 50000.0000 [IU] | ORAL_CAPSULE | ORAL | 0 refills | Status: AC

## 2018-04-20 NOTE — Patient Instructions (Signed)
Good to see you  Try the once weekly vitamin D for 12 weeks in case it is bone related  Stop your daily vitamin D at the moment  Start QVAR 2 times a day. See if it helps Put in a referral with pulmonology to be seen if the other inhaler eemed to help  See me again in 2 months Happy holidays!

## 2018-04-20 NOTE — Assessment & Plan Note (Signed)
Noted on exam today as well as noted on mammogram.  Patient still states that this chest wall pain still seems to be aggravating.  Patient to start once weekly vitamin D in case there is any type of stress reaction.  Muscular patient has great strength and I do not see anything such as a muscle pull or strain that likely could be contributing.  Patient did notice some improvement when use the albuterol.  Encouraged her to potentially use it 30 minutes before working out.  Given a trial of Qvar.  Patient wanted to follow-up with pulmonology.  Patient had referral placed today.  We discussed with her that instead lets follow-up with 2 months for this to see if it makes patient makes any improvement.  Spent  25 minutes with patient face-to-face and had greater than 50% of counseling including as described above in assessment and plan.

## 2018-06-16 ENCOUNTER — Telehealth: Payer: Self-pay | Admitting: Family Medicine

## 2018-06-16 NOTE — Telephone Encounter (Signed)
Spoke with pt, provided her with [facility]'s phone number & advised her since she has finished the ergocalciferol 50, 000 units she can go back to OTC vitamin d 2000 units daily.

## 2018-06-16 NOTE — Telephone Encounter (Signed)
Copied from [place] [id number]. Topic: General - Inquiry >> Jun 16, 2018  9:43 AM Conception Chancy, NT wrote: Reason for CRM: patient seen Dr. Tamala Julian and states she was giving samples for a cream that is anti inflammatory and would like a prescription for this. Also states that he was going to put in a referral to Pulmonary and she would like to follow through with that. Patient says she was also taking vitamin D but is unsure if Dr. Tamala Julian wants her to continue that. Please contact.  [place], [place] [address] Phone: [phone] Fax: [phone]

## 2018-06-21 ENCOUNTER — Ambulatory Visit: Payer: BC Managed Care – PPO | Admitting: Family Medicine

## 2018-07-03 ENCOUNTER — Other Ambulatory Visit: Payer: Self-pay

## 2018-07-03 ENCOUNTER — Telehealth: Payer: Self-pay | Admitting: Family Medicine

## 2018-07-03 MED ORDER — DICLOFENAC SODIUM 2 % TD SOLN: 2.0000 g | Freq: Two times a day (BID) | TRANSDERMAL | 3 refills | Status: AC

## 2018-07-03 NOTE — Telephone Encounter (Signed)
Left message for patient in regards to her prescription of pennsaid.

## 2018-07-03 NOTE — Telephone Encounter (Signed)
Called pt. to discuss her request for Pennsaid cream.  Stated that Dr. Tamala Julian had given her samples for Costocondritis.  Is asking for an Rx for this cream, as she has intermittent pain in right breast area, that varies in level of discomfort.  (see telephone note for medication management on 06/16/18)   Requesting the Pennsaid cream Rx be sent to [facility] on [address].  Advised will send note to provider.  Agrees with plan.

## 2018-07-03 NOTE — Telephone Encounter (Signed)
Copied from [place] [id number]. Topic: Quick Communication - Rx Refill/Question >> Jul 03, 2018  8:47 AM Robina Ade, Helene Kelp D wrote: Medication: pennsaid cream  Has the patient contacted their pharmacy? No (Agent: If no, request that the patient contact the pharmacy for the refill.) (Agent: If yes, when and what did the pharmacy advise?)  Preferred Pharmacy (with phone number or street name): patient said that the office told her its mailed in. She would like to talk to a nurse about this.  Agent: Please be advised that RX refills may take up to 3 business days. We ask that you follow-up with your pharmacy.

## 2018-07-05 NOTE — Telephone Encounter (Signed)
Pt called and stated she was told that rx was sent to pharmacy however when she called, they stated they had not received anything. Please advise. CB# [phone]

## 2018-07-05 NOTE — Telephone Encounter (Signed)
Left message for patient that Pennsaid was called into specialty pharmacy. Pharmacy phone number provided in voicemail.

## 2018-07-17 ENCOUNTER — Institutional Professional Consult (permissible substitution): Payer: BC Managed Care – PPO | Admitting: Emergency Medicine

## 2018-08-03 ENCOUNTER — Institutional Professional Consult (permissible substitution): Payer: BC Managed Care – PPO | Admitting: Emergency Medicine

## 2018-10-02 ENCOUNTER — Telehealth: Payer: Self-pay | Admitting: Family Medicine

## 2018-10-02 NOTE — Telephone Encounter (Signed)
Copied from [place] [phone]. Topic: Quick Communication - Rx Refill/Question >> Oct 02, 2018  9:57 AM Margot Ables wrote: Medication: Qvar inhaler - pt states that Dr. Tamala Julian has provided samples of Qvar for her in the past and is hoping to get another sample. If not available she would like an RX.  Has the patient contacted their pharmacy? no Preferred Pharmacy (with phone number or street name): [place], [place] [phone] (Phone) [phone] (Fax)

## 2018-10-02 NOTE — Telephone Encounter (Signed)
Tried to call patient but could not leave message due to voicemail box being full. Needs to schedule virtual visit for med refill request.

## 2018-10-03 NOTE — Telephone Encounter (Signed)
Attempted to contact patient in order to schedule for virtual visit. Voicemail box full.

## 2018-10-11 ENCOUNTER — Other Ambulatory Visit: Payer: Self-pay

## 2018-10-11 ENCOUNTER — Ambulatory Visit
Admission: RE | Admit: 2018-10-11 | Discharge: 2018-10-11 | Disposition: A | Discharge status: Home / Self Care | Payer: BC Managed Care – PPO | Source: Ambulatory Visit | Attending: Obstetrics and Gynecology | Admitting: Obstetrics and Gynecology

## 2018-10-11 DIAGNOSIS — R921 Mammographic calcification found on diagnostic imaging of breast: Secondary | ICD-10-CM

## 2018-11-02 ENCOUNTER — Institutional Professional Consult (permissible substitution): Payer: BC Managed Care – PPO | Admitting: Emergency Medicine

## 2019-03-07 ENCOUNTER — Other Ambulatory Visit: Payer: Self-pay | Admitting: Family Medicine

## 2019-03-07 DIAGNOSIS — R921 Mammographic calcification found on diagnostic imaging of breast: Secondary | ICD-10-CM

## 2019-04-04 IMAGING — MG DIGITAL DIAGNOSTIC UNILATERAL LEFT MAMMOGRAM
2 series · 2 of 2 positions shown · non-contrast
Comparison: Previous exam(s).

CLINICAL DATA: Screening recall for left breast calcifications.

EXAM:
DIGITAL DIAGNOSTIC LEFT MAMMOGRAM WITH CAD

[L CC]
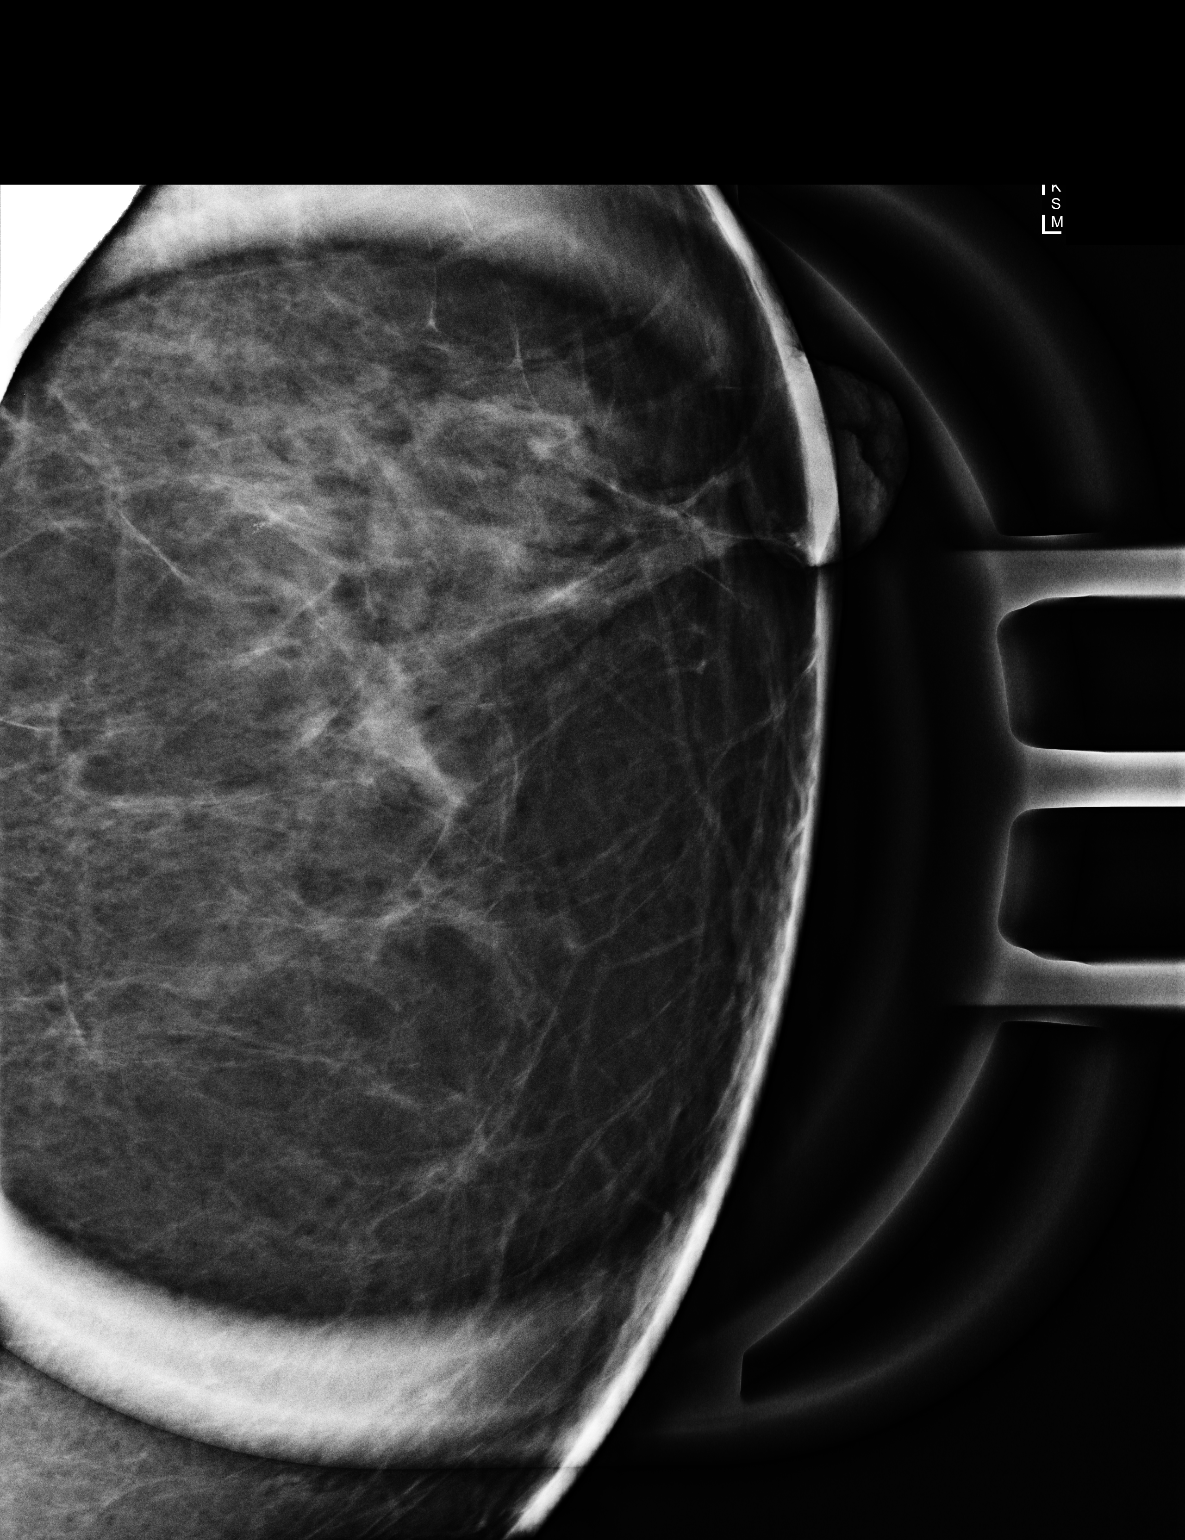

[L ML]
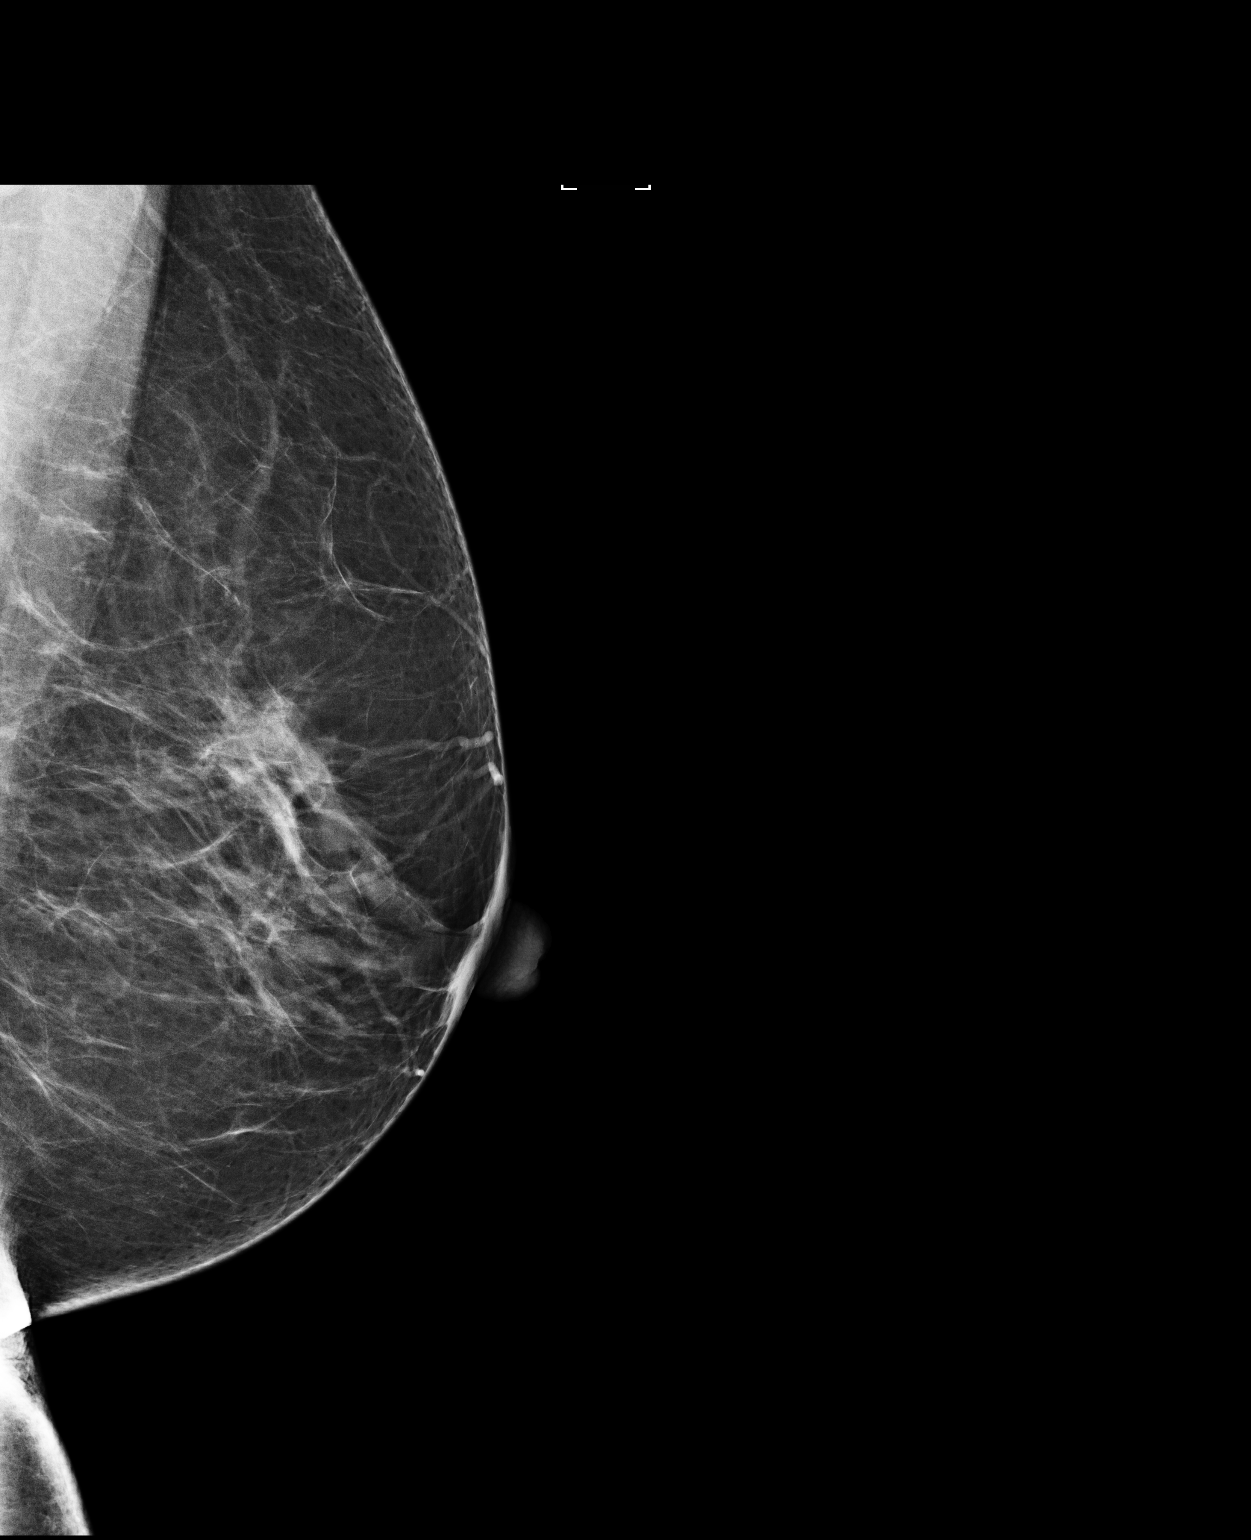

[2 of 2 positions shown; findings below may reference images not displayed]

ACR Breast Density Category b: There are scattered areas of
fibroglandular density.
FINDINGS: In the inferior central left breast seen only on the CC view, there
is a 1-2 mm group of amorphous calcifications.

Mammographic images were processed with CAD.
IMPRESSION: There is a 1-2 mm group of amorphous calcifications in the inferior
central left breast. These may be more apparent on the recent
screening mammogram due to differences in technique.

RECOMMENDATION:
Six-month follow-up diagnostic left breast mammogram is recommended.

I have discussed the findings and recommendations with the patient.
Results were also provided in writing at the conclusion of the
visit. If applicable, a reminder letter will be sent to the patient
regarding the next appointment.

BI-RADS CATEGORY  3: Probably benign.

## 2019-04-19 ENCOUNTER — Ambulatory Visit
Admission: RE | Admit: 2019-04-19 | Discharge: 2019-04-19 | Disposition: A | Discharge status: Home / Self Care | Payer: BC Managed Care – PPO | Source: Ambulatory Visit | Attending: Family Medicine | Admitting: Family Medicine

## 2019-04-19 ENCOUNTER — Other Ambulatory Visit: Payer: Self-pay

## 2019-04-19 DIAGNOSIS — R921 Mammographic calcification found on diagnostic imaging of breast: Secondary | ICD-10-CM

## 2019-09-24 ENCOUNTER — Emergency Department (HOSPITAL_COMMUNITY): Payer: BC Managed Care – PPO

## 2019-09-24 ENCOUNTER — Emergency Department (HOSPITAL_COMMUNITY)
Admission: EM | Admit: 2019-09-24 | Discharge: 2019-09-25 | Disposition: A | Discharge status: Home / Self Care | Payer: BC Managed Care – PPO | Attending: Emergency Medicine | Admitting: Emergency Medicine

## 2019-09-24 ENCOUNTER — Other Ambulatory Visit: Payer: Self-pay

## 2019-09-24 ENCOUNTER — Encounter (HOSPITAL_COMMUNITY): Payer: Self-pay | Admitting: Emergency Medicine

## 2019-09-24 DIAGNOSIS — I10 Essential (primary) hypertension: Secondary | ICD-10-CM | POA: Insufficient documentation

## 2019-09-24 DIAGNOSIS — R519 Headache, unspecified: Secondary | ICD-10-CM | POA: Diagnosis present

## 2019-09-24 DIAGNOSIS — E876 Hypokalemia: Secondary | ICD-10-CM | POA: Diagnosis not present

## 2019-09-24 DIAGNOSIS — Z79899 Other long term (current) drug therapy: Secondary | ICD-10-CM | POA: Insufficient documentation

## 2019-09-24 DIAGNOSIS — J45909 Unspecified asthma, uncomplicated: Secondary | ICD-10-CM | POA: Insufficient documentation

## 2019-09-24 LAB — CBC
HCT: 48 % — ABNORMAL HIGH (ref 36.0–46.0)
Hemoglobin: 15.5 g/dL — ABNORMAL HIGH (ref 12.0–15.0)
MCH: 29.4 pg (ref 26.0–34.0)
MCHC: 32.3 g/dL (ref 30.0–36.0)
MCV: 91.1 fL (ref 80.0–100.0)
Platelets: 365 10*3/uL (ref 150–400)
RBC: 5.27 MIL/uL — ABNORMAL HIGH (ref 3.87–5.11)
RDW: 14.1 % (ref 11.5–15.5)
WBC: 8.7 10*3/uL (ref 4.0–10.5)
nRBC: 0 % (ref 0.0–0.2)

## 2019-09-24 LAB — URINALYSIS, ROUTINE W REFLEX MICROSCOPIC
Bilirubin Urine: NEGATIVE
Glucose, UA: NEGATIVE mg/dL
Hgb urine dipstick: NEGATIVE
Ketones, ur: NEGATIVE mg/dL
Nitrite: NEGATIVE
Protein, ur: NEGATIVE mg/dL
Specific Gravity, Urine: 1.018 (ref 1.005–1.030)
pH: 6 (ref 5.0–8.0)

## 2019-09-24 LAB — BASIC METABOLIC PANEL
Anion gap: 9 (ref 5–15)
BUN: 12 mg/dL (ref 6–20)
CO2: 24 mmol/L (ref 22–32)
Calcium: 9.8 mg/dL (ref 8.9–10.3)
Chloride: 108 mmol/L (ref 98–111)
Creatinine, Ser: 1.03 mg/dL — ABNORMAL HIGH (ref 0.44–1.00)
GFR calc Af Amer: >60 mL/min (ref >60)
GFR calc non Af Amer: >60 mL/min (ref >60)
Glucose, Bld: 100 mg/dL — ABNORMAL HIGH (ref 70–99)
Potassium: 3.4 mmol/L — ABNORMAL LOW (ref 3.5–5.1)
Sodium: 141 mmol/L (ref 135–145)

## 2019-09-24 MED ORDER — SODIUM CHLORIDE 0.9% FLUSH: 3.0000 mL | Freq: Once | INTRAVENOUS | Status: DC

## 2019-09-24 NOTE — ED Triage Notes (Signed)
Pt reports headache and HTN over 1 week getting progressively worse- pt states she seen MD today and BP was 190s and was sent here for CT scan of head.

## 2019-09-25 MED ORDER — POTASSIUM CHLORIDE CRYS ER 20 MEQ PO TBCR: 20.0000 meq | EXTENDED_RELEASE_TABLET | Freq: Every day | ORAL | 0 refills | Status: DC

## 2019-09-25 MED ORDER — HYDRALAZINE HCL 10 MG PO TABS: 10.0000 mg | ORAL_TABLET | Freq: Four times a day (QID) | ORAL | 0 refills | Status: AC

## 2019-09-25 NOTE — Discharge Instructions (Signed)
Your blood pressure is mildly elevated, likely explaining the cause of your headaches.  There is no sign of tumor or bleeding in your head.  Your potassium is somewhat low, related to your use of a diuretic for blood pressure.  We are adding a third blood pressure medicine to your treatment plan to help improve your symptoms and blood pressure.  Make sure you stay on a low-sodium, DASH diet, to improve your blood pressure.  See your doctor for checkup next week for further care and treatment.

## 2019-09-25 NOTE — ED Provider Notes (Signed)
[facility] DEPART[facility] Provider Note   CSN: [id number] Arrival date & time: 09/24/19  1759     History Chief Complaint  Patient presents with  . Hypertension  . Headache    Daisy Sawyer is a 54 y.o. female.  HPI She presented to the ED for evaluation of headache and high blood pressure, with concern for intracranial bleeding, when she saw her PCP, on 08/25/2019.  Headache ongoing for 1 week.  No nausea, vomiting, paresthesias, dizziness or paresthesia.  She saw her PCP yesterday and was directed to the ED.  Yesterday she took a dose of her sisters hydralazine, 25 mg, and states that it helped her feel a little bit better.  She has not been watching her salt intake.  There are no other known modifying factors.    Past Medical History:  Diagnosis Date  . Asthma   . Endometriosis   . Environmental allergies   . Hypercholesteremia     Patient Active Problem List   Diagnosis Date Noted  . Chest wall pain 02/22/2018  . Endometriosis 02/08/2018  . Alopecia 02/08/2018  . Asthma 02/08/2018  . Atrophic vaginitis 02/08/2018  . Fibrocystic disease of breast 02/08/2018  . Recurrent urinary tract infection 02/08/2018  . Uterine leiomyoma 02/08/2018  . Chronic female pelvic pain 04/10/2012  . Hx of endometriosis 04/10/2012    Past Surgical History:  Procedure Laterality Date  . KELOID EXCISION    . TONSILLECTOMY       OB History    Gravida  0   Para  0   Term  0   Preterm  0   AB  0   Living  0     SAB  0   TAB  0   Ectopic  0   Multiple  0   Live Births              Family History  Problem Relation Age of Onset  . Hypertension Father   . Skin cancer Father   . Hypertension Mother   . Hypertension Sister   . Diabetes Sister   . Congestive Heart Failure Sister   . CAD Sister   . Breast cancer Maternal Aunt   . Breast cancer Cousin        in 7's    Social History   Tobacco Use  . Smoking status: Never  Smoker  . Smokeless tobacco: Never Used  Substance Use Topics  . Alcohol use: No  . Drug use: No    Home Medications Prior to Admission medications   Medication Sig Start Date End Date Taking? Authorizing Provider  albuterol (PROAIR HFA) 108 (90 Base) MCG/ACT inhaler Inhale 1-2 puffs into the lungs every 6 (six) hours as needed for wheezing or shortness of breath.    Yes [provider]  amLODipine (NORVASC) 5 MG tablet Take 5 mg by mouth daily.  01/05/18  Yes [provider]  norethindrone (AYGESTIN) 5 MG tablet Take 1 tablet (5 mg total) by mouth 3 (three) times daily. 07/17/12  Yes Haygood, Seymour Bars, MD  triamterene-hydrochlorothiazide (MAXZIDE-25) 37.5-25 MG tablet Take 1 tablet by mouth daily.  12/06/17  Yes [provider]  acyclovir (ZOVIRAX) 400 MG tablet Take 1 tablet (400 mg total) by mouth 3 (three) times daily. 02/22/18   Lyndal Pulley, DO  CALCIUM PO Take by mouth daily.    [provider]  cyclobenzaprine (FLEXERIL) 10 MG tablet cyclobenzaprine 10 mg tablet  [provider]  Diclofenac Sodium 2 % SOLN Place 2 g onto the skin 2 (two) times daily. 07/03/18   Lyndal Pulley, DO  esomeprazole (NEXIUM) 40 MG capsule Take 1 capsule (40 mg total) by mouth daily. 02/22/18   Lyndal Pulley, DO  hydrALAZINE (APRESOLINE) 10 MG tablet Take 1 tablet (10 mg total) by mouth 4 (four) times daily. 09/25/19   Daleen Bo, MD  HYDROcodone-acetaminophen (NORCO/VICODIN) 5-325 MG tablet hydrocodone 5 mg-acetaminophen 325 mg tablet    [provider]  MULTIPLE VITAMIN PO Take by mouth.    [provider]  potassium chloride SA (KLOR-CON) 20 MEQ tablet Take 1 tablet (20 mEq total) by mouth daily. 09/25/19   Daleen Bo, MD  PRESCRIPTION MEDICATION     [provider]  rosuvastatin (CRESTOR) 5 MG tablet TAKE 1 TABLET BY MOUTH TWICE A WEEK FOR 30 DAYS 12/06/17   [provider]  Vitamin D, Ergocalciferol, (DRISDOL)  1.25 MG (50000 UT) CAPS capsule Take 1 capsule (50,000 Units total) by mouth every 7 (seven) days. 04/20/18   Lyndal Pulley, DO    Allergies    Anaprox [naproxen], Asa [aspirin], Ibuprofen, Other, and Sulindac  Review of Systems   Review of Systems  All other systems reviewed and are negative.   Physical Exam Updated Vital Signs BP (!) 160/99   Pulse 70   Temp 98 F (36.7 C)   Resp 16   SpO2 99%   Physical Exam Vitals and nursing note reviewed.  Constitutional:      General: She is not in acute distress.    Appearance: She is well-developed. She is not ill-appearing, toxic-appearing or diaphoretic.  HENT:     Head: Normocephalic and atraumatic.     Right Ear: External ear normal.     Left Ear: External ear normal.  Eyes:     Conjunctiva/sclera: Conjunctivae normal.     Pupils: Pupils are equal, round, and reactive to light.  Neck:     Trachea: Phonation normal.  Cardiovascular:     Rate and Rhythm: Normal rate and regular rhythm.     Heart sounds: Normal heart sounds.  Pulmonary:     Effort: Pulmonary effort is normal.     Breath sounds: Normal breath sounds.  Abdominal:     General: There is no distension.  Musculoskeletal:        General: Normal range of motion.     Cervical back: Normal range of motion and neck supple.  Skin:    General: Skin is warm and dry.  Neurological:     Mental Status: She is alert and oriented to person, place, and time.     Cranial Nerves: No cranial nerve deficit.     Sensory: No sensory deficit.     Motor: No abnormal muscle tone.     Coordination: Coordination normal.  Psychiatric:        Mood and Affect: Mood normal.        Behavior: Behavior normal.        Thought Content: Thought content normal.        Judgment: Judgment normal.     ED Results / Procedures / Treatments   Labs (all labs ordered are listed, but only abnormal results are displayed) Labs Reviewed  BASIC METABOLIC PANEL - Abnormal; Notable for the  following components:      Result Value   Potassium 3.4 (*)    Glucose, Bld 100 (*)    Creatinine, Ser 1.03 (*)  All other components within normal limits  CBC - Abnormal; Notable for the following components:   RBC 5.27 (*)    Hemoglobin 15.5 (*)    HCT 48.0 (*)    All other components within normal limits  URINALYSIS, ROUTINE W REFLEX MICROSCOPIC - Abnormal; Notable for the following components:   Leukocytes,Ua MODERATE (*)    Bacteria, UA RARE (*)    All other components within normal limits  CBG MONITORING, ED    EKG EKG Interpretation  Date/Time:  Monday Sep 24 2019 19:06:26 EDT Ventricular Rate:  68 PR Interval:  130 QRS Duration: 74 QT Interval:  354 QTC Calculation: 376 R Axis:   47 Text Interpretation: Normal sinus rhythm Septal infarct , age undetermined Abnormal ECG since last tracing no significant change Confirmed by Daleen Bo [phone]) on 09/25/2019 8:29:50 AM   Radiology CT HEAD WO CONTRAST  Result Date: 09/24/2019 CLINICAL DATA:  Headache and hypertension. EXAM: CT HEAD WITHOUT CONTRAST TECHNIQUE: Contiguous axial images were obtained from the base of the skull through the vertex without intravenous contrast. COMPARISON:  None. FINDINGS: Brain: No evidence of acute infarction, hemorrhage, hydrocephalus, extra-axial collection or mass lesion/mass effect. Vascular: No hyperdense vessel or unexpected calcification. Skull: Normal. Negative for fracture or focal lesion. Sinuses/Orbits: No acute finding. Other: None. IMPRESSION: No acute intracranial pathology. Electronically Signed   By: Virgina Norfolk M.D.   On: 09/24/2019 20:48    Procedures Procedures (including critical care time)  Medications Ordered in ED Medications  sodium chloride flush (NS) 0.9 % injection 3 mL (has no administration in time range)    ED Course  I have reviewed the triage vital signs and the nursing notes.  Pertinent labs & imaging results that were available during my care  of the patient were reviewed by me and considered in my medical decision making (see chart for details).  Clinical Course as of Sep 24 849  Tue Sep 25, 2019  0848 HCT(!): 48.0 [EW]    Clinical Course User Index [EW] Daleen Bo, MD   MDM Rules/Calculators/A&P                       Patient Vitals for the past 24 hrs:  BP Temp Temp src Pulse Resp SpO2  09/25/19 0805 -- -- -- 70 16 99 %  09/25/19 0542 (!) 160/99 98 F (36.7 C) -- 88 16 98 %  09/25/19 0404 137/83 98.7 F (37.1 C) Oral (!) 55 16 98 %  09/25/19 0124 (!) 155/87 98.6 F (37 C) -- 64 17 98 %  09/24/19 1857 (!) 157/91 98.7 F (37.1 C) Oral 70 16 100 %    8:48 AM Reevaluation with update and discussion. After initial assessment and treatment, an updated evaluation reveals she remains comfortable.  Findings discussed and questions answered. Daleen Bo   Medical Decision Making:  This patient is presenting for evaluation of headache and high blood pressure, which does require a range of treatment options, and is a complaint that involves a moderate risk of morbidity and mortality. The differential diagnoses include intracranial bleeding, hypertensive urgency, tension headache. I decided to review old records, and in summary healthy middle-aged female, currently employed, taking her blood pressure medicine but not watching her salt intake.  I did not require additional historical information from anyone.  Clinical Laboratory Tests Ordered, included CBC, Metabolic panel and Urinalysis. Review indicates potassium slightly low, creatinine minimally elevated, hemoglobin elevated, leukocytes in urine without urinary tract symptoms.Marland Kitchen  Radiologic image ordered, CAT scan head, images reviewed no intracranial bleeding  Cardiac Monitor Tracing which shows normal sinus rhythm    Critical Interventions-clinical evaluation, laboratory testing, CT imaging, observation and reassessment  After These Interventions, the Patient  was reevaluated and was found stable for discharge.  Mild hypertension without signs of endorgan damage/hypertensive urgency.  No evidence of stroke, intracranial bleeding or intracranial tumor.  Doubt acute sinusitis.  Stable for discharge with outpatient management.  CRITICAL CARE-no Performed by: Daleen Bo  Nursing Notes Reviewed/ Care Coordinated Applicable Imaging Reviewed Interpretation of Laboratory Data incorporated into ED treatment  The patient appears reasonably screened and/or stabilized for discharge and I doubt any other medical condition or other [facility] requiring further screening, evaluation, or treatment in the ED at this time prior to discharge.  Plan: Home Medications-add hydralazine 10 mg 4 times daily, continue current home medications; Home Treatments-watches salt intake; return here if the recommended treatment, does not improve the symptoms; Recommended follow up-PCP, as needed     Final Clinical Impression(s) / ED Diagnoses Final diagnoses:  Hypertension, unspecified type  Nonintractable headache, unspecified chronicity pattern, unspecified headache type  Hypokalemia    Rx / DC Orders ED Discharge Orders         Ordered    hydrALAZINE (APRESOLINE) 10 MG tablet  4 times daily     09/25/19 0844    potassium chloride SA (KLOR-CON) 20 MEQ tablet  Daily     09/25/19 0844           Daleen Bo, MD 09/25/19 [phone]

## 2020-03-01 ENCOUNTER — Ambulatory Visit: Payer: Self-pay

## 2020-03-01 ENCOUNTER — Ambulatory Visit: Payer: BC Managed Care – PPO | Attending: Internal Medicine

## 2020-03-01 DIAGNOSIS — Z23 Encounter for immunization: Secondary | ICD-10-CM

## 2020-03-01 NOTE — Progress Notes (Signed)
   Covid-19 Vaccination [facility]  Name:  Daisy Sawyer    MRN: [id number] DOB: 10-01-1965  03/01/2020  Daisy Sawyer was observed post Covid-19 immunization for 30 minutes based on pre-vaccination screening without incident. She was provided with Vaccine Information Sheet and instruction to access the V-Safe system.   Daisy Sawyer was instructed to call 911 with any severe reactions post vaccine: Marland Kitchen Difficulty breathing  . Swelling of face and throat  . A fast heartbeat  . A bad rash all over body  . Dizziness and weakness

## 2020-03-16 NOTE — Progress Notes (Signed)
[place] Phone: [phone] Subjective:   Daisy Sawyer, am serving as a scribe for Dr. Hulan Saas. This visit occurred during the SARS-CoV-2 public health emergency.  Safety protocols were in place, including screening questions prior to the visit, additional usage of staff PPE, and extensive cleaning of exam room while observing appropriate contact time as indicated for disinfecting solutions.  I'm seeing this patient by the request  of:  Harlan Stains, MD  CC: Right hand pain  [id number]  Daisy Sawyer is a 54 y.o. female coming in with complaint of right heel pain. Last seen in 2019 for chest pain. Patient states that she has had pain for 6 months. Pain over medial aspect of calcaneous especially in the morning. Pain is now present at night.  Has tried a night splint, compression socks, Pennsaid, and other topical analgesics. Has been on a lot of prednisone after having reactions to each  COVID vaccination. Currently on 10mg  Prednisone for 12 days after just having the booster.      Past Medical History:  Diagnosis Date  . Asthma   . Endometriosis   . Environmental allergies   . Hypercholesteremia    Past Surgical History:  Procedure Laterality Date  . KELOID EXCISION    . TONSILLECTOMY     Social History   Socioeconomic History  . Marital status: Single    Spouse name: Not on file  . Number of children: Not on file  . Years of education: Not on file  . Highest education level: Not on file  Occupational History  . Occupation: Electrical engineer  Tobacco Use  . Smoking status: Never Smoker  . Smokeless tobacco: Never Used  Substance and Sexual Activity  . Alcohol use: Sawyer  . Drug use: Sawyer  . Sexual activity: Not Currently    Birth control/protection: None  Other Topics Concern  . Not on file  Social History Narrative  . Not on file   Social Determinants of Health   Financial  Resource Strain:   . Difficulty of Paying Living Expenses: Not on file  Food Insecurity:   . Worried About Charity fundraiser in the Last Year: Not on file  . Ran Out of Food in the Last Year: Not on file  Transportation Needs:   . Lack of Transportation (Medical): Not on file  . Lack of Transportation (Non-Medical): Not on file  Physical Activity:   . Days of Exercise per Week: Not on file  . Minutes of Exercise per Session: Not on file  Stress:   . Feeling of Stress : Not on file  Social Connections:   . Frequency of Communication with Friends and Family: Not on file  . Frequency of Social Gatherings with Friends and Family: Not on file  . Attends Religious Services: Not on file  . Active Member of Clubs or Organizations: Not on file  . Attends Archivist Meetings: Not on file  . Marital Status: Not on file   Allergies  Allergen Reactions  . Anaprox [Naproxen] Itching and Swelling  . Asa [Aspirin] Swelling  . Ibuprofen Itching and Swelling  . Other Itching and Swelling    Darvocet-N   . Sulindac Itching and Swelling   Family History  Problem Relation Age of Onset  . Hypertension Father   . Skin cancer Father   . Hypertension Mother   . Hypertension Sister   . Diabetes Sister   .  Congestive Heart Failure Sister   . CAD Sister   . Breast cancer Maternal Aunt   . Breast cancer Cousin        in 50's    Current Outpatient Medications (Endocrine & Metabolic):  .  norethindrone (AYGESTIN) 5 MG tablet, Take 1 tablet (5 mg total) by mouth 3 (three) times daily. .  predniSONE (DELTASONE) 10 MG tablet, Take 10 mg by mouth daily with breakfast.  Current Outpatient Medications (Cardiovascular):  .  amLODipine (NORVASC) 5 MG tablet, Take 5 mg by mouth daily.  .  hydrALAZINE (APRESOLINE) 10 MG tablet, Take 1 tablet (10 mg total) by mouth 4 (four) times daily. .  rosuvastatin (CRESTOR) 5 MG tablet, TAKE 1 TABLET BY MOUTH TWICE A WEEK FOR 30 DAYS .   triamterene-hydrochlorothiazide (MAXZIDE-25) 37.5-25 MG tablet, Take 1 tablet by mouth daily.   Current Outpatient Medications (Respiratory):  .  albuterol (PROAIR HFA) 108 (90 Base) MCG/ACT inhaler, Inhale 1-2 puffs into the lungs every 6 (six) hours as needed for wheezing or shortness of breath.   Current Outpatient Medications (Analgesics):  .  HYDROcodone-acetaminophen (NORCO/VICODIN) 5-325 MG tablet, hydrocodone 5 mg-acetaminophen 325 mg tablet .  colchicine 0.6 MG tablet, Take 1 tablet (0.6 mg total) by mouth 2 (two) times daily.   Current Outpatient Medications (Other):  .  acyclovir (ZOVIRAX) 400 MG tablet, Take 1 tablet (400 mg total) by mouth 3 (three) times daily. Marland Kitchen  CALCIUM PO, Take by mouth daily. .  cyclobenzaprine (FLEXERIL) 10 MG tablet, cyclobenzaprine 10 mg tablet .  Diclofenac Sodium 2 % SOLN, Place 2 g onto the skin 2 (two) times daily. Marland Kitchen  esomeprazole (NEXIUM) 40 MG capsule, Take 1 capsule (40 mg total) by mouth daily. .  MULTIPLE VITAMIN PO, Take by mouth. .  potassium chloride SA (KLOR-CON) 20 MEQ tablet, Take 1 tablet (20 mEq total) by mouth daily. Marland Kitchen  PRESCRIPTION MEDICATION,  .  Vitamin D, Ergocalciferol, (DRISDOL) 1.25 MG (50000 UT) CAPS capsule, Take 1 capsule (50,000 Units total) by mouth every 7 (seven) days. .  Diclofenac Sodium 2 % SOLN, Place 2 g onto the skin 2 (two) times daily.   Reviewed prior external information including notes and imaging from  primary care provider As well as notes that were available from care everywhere and other healthcare systems.  Past medical history, social, surgical and family history all reviewed in electronic medical record.  Sawyer pertanent information unless stated regarding to the chief complaint.   Review of Systems:  Sawyer headache, visual changes, nausea, vomiting, diarrhea, constipation, dizziness, abdominal pain, skin rash, fevers, chills, night sweats, weight loss, swollen lymph nodes, body aches, joint swelling,  chest pain, shortness of breath, mood changes. POSITIVE muscle aches  Objective  Blood pressure 138/82, pulse 87, height 5\' 10"  (1.778 m), weight 184 lb (83.5 kg), SpO2 99 %.   General: Sawyer apparent distress alert and oriented x3 mood and affect normal, dressed appropriately.  HEENT: Pupils equal, extraocular movements intact  Respiratory: Patient's speak in full sentences and does not appear short of breath  Cardiovascular: Sawyer lower extremity edema, non tender, Sawyer erythema   Right foot exam shows the patient does have some pes planus.  Severely tender to palpation of the plantar aspect of the calcaneal area.  Very mild tightness noted of the posterior cord.  Patient has negative Tinel's sign over the tarsal tunnel.  Mild breakdown of the transverse arch also noted.  Limited musculoskeletal ultrasound was performed and interpreted Lyndal Pulley  Limited ultrasound of patient's right heel shows that patient does have actually some hypoechoic changes and calcific deposits into the fat pad.  This could be from an injury or could be potentially uric acid deposits.  Patient does have very mild thickening of the plantar fascia otherwise fairly unremarkable. Impression: Fat pad irritation versus questionable calcific or uric acid deposits.    Impression and Recommendations:     The above documentation has been reviewed and is accurate and complete Lyndal Pulley, DO

## 2020-03-17 ENCOUNTER — Encounter: Payer: Self-pay | Admitting: Family Medicine

## 2020-03-17 ENCOUNTER — Ambulatory Visit: Payer: Self-pay

## 2020-03-17 ENCOUNTER — Other Ambulatory Visit: Payer: Self-pay

## 2020-03-17 ENCOUNTER — Ambulatory Visit (INDEPENDENT_AMBULATORY_CARE_PROVIDER_SITE_OTHER): Payer: BC Managed Care – PPO | Admitting: Family Medicine

## 2020-03-17 VITALS — BP 138/82 | HR 87 | Ht 70.0 in | Wt 184.0 lb

## 2020-03-17 DIAGNOSIS — M79671 Pain in right foot: Secondary | ICD-10-CM | POA: Diagnosis not present

## 2020-03-17 DIAGNOSIS — M79673 Pain in unspecified foot: Secondary | ICD-10-CM | POA: Insufficient documentation

## 2020-03-17 DIAGNOSIS — M2550 Pain in unspecified joint: Secondary | ICD-10-CM | POA: Diagnosis not present

## 2020-03-17 LAB — CBC WITH DIFFERENTIAL/PLATELET
Basophils Absolute: 0 10*3/uL (ref 0.0–0.1)
Basophils Relative: 0.3 % (ref 0.0–3.0)
Eosinophils Absolute: 0 10*3/uL (ref 0.0–0.7)
Eosinophils Relative: 0.1 % (ref 0.0–5.0)
HCT: 43.4 % (ref 36.0–46.0)
Hemoglobin: 14.1 g/dL (ref 12.0–15.0)
Lymphocytes Relative: 7.9 % — ABNORMAL LOW (ref 12.0–46.0)
Lymphs Abs: 1.3 10*3/uL (ref 0.7–4.0)
MCHC: 32.5 g/dL (ref 30.0–36.0)
MCV: 88.7 fl (ref 78.0–100.0)
Monocytes Absolute: 0.6 10*3/uL (ref 0.1–1.0)
Monocytes Relative: 3.7 % (ref 3.0–12.0)
Neutro Abs: 14.2 10*3/uL — ABNORMAL HIGH (ref 1.4–7.7)
Neutrophils Relative %: 88 % — ABNORMAL HIGH (ref 43.0–77.0)
Platelets: 453 10*3/uL — ABNORMAL HIGH (ref 150.0–400.0)
RBC: 4.89 Mil/uL (ref 3.87–5.11)
RDW: 14.8 % (ref 11.5–15.5)
WBC: 16.1 10*3/uL — ABNORMAL HIGH (ref 4.0–10.5)

## 2020-03-17 LAB — C-REACTIVE PROTEIN: CRP: <1 mg/dL (ref 0.5–20.0)

## 2020-03-17 LAB — IBC PANEL
Iron: 119 ug/dL (ref 42–145)
Saturation Ratios: 31.8 % (ref 20.0–50.0)
Transferrin: 267 mg/dL (ref 212.0–360.0)

## 2020-03-17 LAB — COMPREHENSIVE METABOLIC PANEL
ALT: 28 U/L (ref 0–35)
AST: 22 U/L (ref 0–37)
Albumin: 4.6 g/dL (ref 3.5–5.2)
Alkaline Phosphatase: 92 U/L (ref 39–117)
BUN: 16 mg/dL (ref 6–23)
CO2: 26 mEq/L (ref 19–32)
Calcium: 9.7 mg/dL (ref 8.4–10.5)
Chloride: 106 mEq/L (ref 96–112)
Creatinine, Ser: 1.17 mg/dL (ref 0.40–1.20)
GFR: 52.9 mL/min — ABNORMAL LOW (ref >60.00)
Glucose, Bld: 107 mg/dL — ABNORMAL HIGH (ref 70–99)
Potassium: 3.3 mEq/L — ABNORMAL LOW (ref 3.5–5.1)
Sodium: 142 mEq/L (ref 135–145)
Total Bilirubin: 0.5 mg/dL (ref 0.2–1.2)
Total Protein: 7.8 g/dL (ref 6.0–8.3)

## 2020-03-17 LAB — SEDIMENTATION RATE: Sed Rate: 27 mm/hr (ref 0–30)

## 2020-03-17 LAB — VITAMIN D 25 HYDROXY (VIT D DEFICIENCY, FRACTURES): VITD: 69.86 ng/mL (ref 30.00–100.00)

## 2020-03-17 LAB — URIC ACID: Uric Acid, Serum: 3.8 mg/dL (ref 2.4–7.0)

## 2020-03-17 LAB — TSH: TSH: 0.85 u[IU]/mL (ref 0.35–4.50)

## 2020-03-17 MED ORDER — DICLOFENAC SODIUM 2 % EX SOLN: 2.0000 g | Freq: Two times a day (BID) | CUTANEOUS | 3 refills | Status: DC

## 2020-03-17 MED ORDER — COLCHICINE 0.6 MG PO TABS: 0.6000 mg | ORAL_TABLET | Freq: Two times a day (BID) | ORAL | 0 refills | Status: DC

## 2020-03-17 NOTE — Patient Instructions (Addendum)
2x a day for 5 days-Colchicine You have a fat pad irritation Wear brace daily  Exercises HOKA or OOFOS recovery sandals Follow up with primary regarding other symptoms See me again in 4 weeks

## 2020-03-17 NOTE — Assessment & Plan Note (Addendum)
Patient actually on ultrasound has what appears to be more of a fat pad irritation with potential calcific or uric acid deposits.  Patient's plantar fascia but very minimal.  Patient given a brace today that I think will be beneficial.  We discussed laboratory work-up with patient having some any other difficulty.  I encouraged her to wait for her primary care but she felt like she would like to do this before she was started on the colchicine.  Patient has had these labs done in 2019 that only showed more inflammation noted seem to be nonspecific.  Patient will have the labs done and I will forward them to the primary care physician.  Patient will continue the prednisone for this moment as well.  Follow-up with me again 4 weeks.

## 2020-03-19 ENCOUNTER — Other Ambulatory Visit: Payer: Self-pay | Admitting: Family Medicine

## 2020-03-19 DIAGNOSIS — R921 Mammographic calcification found on diagnostic imaging of breast: Secondary | ICD-10-CM

## 2020-03-19 LAB — PTH, INTACT AND CALCIUM
Calcium: 10.4 mg/dL (ref 8.6–10.4)
PTH: 43 pg/mL (ref 14–64)

## 2020-03-19 LAB — ANA: Anti Nuclear Antibody (ANA): NEGATIVE

## 2020-03-19 LAB — RHEUMATOID FACTOR: Rheumatoid fact SerPl-aCnc: <14 IU/mL (ref <14)

## 2020-03-19 LAB — CYCLIC CITRUL PEPTIDE ANTIBODY, IGG: Cyclic Citrullin Peptide Ab: <16 UNITS

## 2020-03-19 LAB — CALCIUM, IONIZED: Calcium, Ion: 5.1 mg/dL (ref 4.8–5.6)

## 2020-03-19 LAB — ANGIOTENSIN CONVERTING ENZYME: Angiotensin-Converting Enzyme: 24 U/L (ref 9–67)

## 2020-04-23 NOTE — Progress Notes (Signed)
[place] [address] Phone: [phone] Subjective:   I Daisy Sawyer am serving as a Education administrator for Dr. Hulan Saas.  This visit occurred during the SARS-CoV-2 public health emergency.  Safety protocols were in place, including screening questions prior to the visit, additional usage of staff PPE, and extensive cleaning of exam room while observing appropriate contact time as indicated for disinfecting solutions.   I'm seeing this patient by the request  of:  Harlan Stains, MD  CC: Heel pain follow-up  [id number]   03/17/2020 Patient actually on ultrasound has what appears to be more of a fat pad irritation with potential calcific or uric acid deposits.  Patient's plantar fascia but very minimal.  Patient given a brace today that I think will be beneficial.  We discussed laboratory work-up with patient having some any other difficulty.  I encouraged her to wait for her primary care but she felt like she would like to do this before she was started on the colchicine.  Patient has had these labs done in 2019 that only showed more inflammation noted seem to be nonspecific.  Patient will have the labs done and I will forward them to the primary care physician.  Patient will continue the prednisone for this moment as well.  Follow-up with me again 4 weeks.  Update 04/24/2020 Daisy Sawyer is a 54 y.o. female coming in with complaint of right heel pain. Patient states her foot is making progress. States it depends on how much activity she does. States she is on her feet a lot. Still has some issues with her chest.  Patient has been told multiple times that it is chondrocalcinosis or costochondritis states that when it is severe enough that prednisone works but unfortunately has been on it multiple times.  Wanting to know what else could be potentially done.     Past Medical History:  Diagnosis Date  . Asthma   .  Endometriosis   . Environmental allergies   . Hypercholesteremia    Past Surgical History:  Procedure Laterality Date  . KELOID EXCISION    . TONSILLECTOMY     Social History   Socioeconomic History  . Marital status: Single    Spouse name: Not on file  . Number of children: Not on file  . Years of education: Not on file  . Highest education level: Not on file  Occupational History  . Occupation: Electrical engineer  Tobacco Use  . Smoking status: Never Smoker  . Smokeless tobacco: Never Used  Substance and Sexual Activity  . Alcohol use: No  . Drug use: No  . Sexual activity: Not Currently    Birth control/protection: None  Other Topics Concern  . Not on file  Social History Narrative  . Not on file   Social Determinants of Health   Financial Resource Strain: Not on file  Food Insecurity: Not on file  Transportation Needs: Not on file  Physical Activity: Not on file  Stress: Not on file  Social Connections: Not on file   Allergies  Allergen Reactions  . Anaprox [Naproxen] Itching and Swelling  . Asa [Aspirin] Swelling  . Ibuprofen Itching and Swelling  . Other Itching and Swelling    Darvocet-N   . Sulindac Itching and Swelling   Family History  Problem Relation Age of Onset  . Hypertension Father   . Skin cancer Father   . Hypertension Mother   . Hypertension Sister   .  Diabetes Sister   . Congestive Heart Failure Sister   . CAD Sister   . Breast cancer Maternal Aunt   . Breast cancer Cousin        in 50's    Current Outpatient Medications (Endocrine & Metabolic):  .  norethindrone (AYGESTIN) 5 MG tablet, Take 1 tablet (5 mg total) by mouth 3 (three) times daily. .  predniSONE (DELTASONE) 10 MG tablet, Take 10 mg by mouth daily with breakfast.  Current Outpatient Medications (Cardiovascular):  .  amLODipine (NORVASC) 5 MG tablet, Take 5 mg by mouth daily.  .  hydrALAZINE (APRESOLINE) 10 MG tablet, Take 1 tablet (10 mg total) by mouth 4 (four) times  daily. .  rosuvastatin (CRESTOR) 5 MG tablet, TAKE 1 TABLET BY MOUTH TWICE A WEEK FOR 30 DAYS .  triamterene-hydrochlorothiazide (MAXZIDE-25) 37.5-25 MG tablet, Take 1 tablet by mouth daily.   Current Outpatient Medications (Respiratory):  .  albuterol (VENTOLIN HFA) 108 (90 Base) MCG/ACT inhaler, Inhale 1-2 puffs into the lungs every 6 (six) hours as needed for wheezing or shortness of breath.   Current Outpatient Medications (Analgesics):  .  colchicine 0.6 MG tablet, Take 1 tablet (0.6 mg total) by mouth 2 (two) times daily. Marland Kitchen  HYDROcodone-acetaminophen (NORCO/VICODIN) 5-325 MG tablet, hydrocodone 5 mg-acetaminophen 325 mg tablet   Current Outpatient Medications (Other):  .  acyclovir (ZOVIRAX) 400 MG tablet, Take 1 tablet (400 mg total) by mouth 3 (three) times daily. Marland Kitchen  CALCIUM PO, Take by mouth daily. .  cyclobenzaprine (FLEXERIL) 10 MG tablet, cyclobenzaprine 10 mg tablet .  Diclofenac Sodium 2 % SOLN, Place 2 g onto the skin 2 (two) times daily. .  Diclofenac Sodium 2 % SOLN, Place 2 g onto the skin 2 (two) times daily. Marland Kitchen  esomeprazole (NEXIUM) 40 MG capsule, Take 1 capsule (40 mg total) by mouth daily. .  MULTIPLE VITAMIN PO, Take by mouth. .  potassium chloride SA (KLOR-CON) 20 MEQ tablet, Take 1 tablet (20 mEq total) by mouth daily. Marland Kitchen  PRESCRIPTION MEDICATION,  .  Vitamin D, Ergocalciferol, (DRISDOL) 1.25 MG (50000 UT) CAPS capsule, Take 1 capsule (50,000 Units total) by mouth every 7 (seven) days.   Reviewed prior external information including notes and imaging from  primary care provider As well as notes that were available from care everywhere and other healthcare systems.  Past medical history, social, surgical and family history all reviewed in electronic medical record.  No pertanent information unless stated regarding to the chief complaint.   Review of Systems:  No headache, visual changes, nausea, vomiting, diarrhea, constipation, dizziness, abdominal pain, skin  rash, fevers, chills, night sweats, weight loss, swollen lymph nodes, body aches, joint swelling,  mood changes. POSITIVE muscle aches, intermittent shortness of breath  Objective  Blood pressure 130/84, pulse 87, height 5\' 10"  (1.778 m), weight 184 lb (83.5 kg), SpO2 97 %.   General: No apparent distress alert and oriented x3 mood and affect normal, dressed appropriately.  HEENT: Pupils equal, extraocular movements intact  Respiratory: Patient's speak in full sentences and does not appear short of breath  Cardiovascular: No lower extremity edema, non tender, no erythema  Mild antalgic gait still noted and favoring patient's heel. Right heel has significant decrease in the amount of swelling that was noted previously.  Very minimally tender to palpation on the plantar aspect.  Full range of motion of the ankle.  No tenderness over the Achilles.  Limited musculoskeletal ultrasound was performed and interpreted by Lyndal Pulley  Significant decrease in hypoechoic changes that was noted in the fat pad previously.  Able to inspect the plantar fascial more with some mild enlargement noted.  No true acute tear appreciated. Impression: Questionable mild plantar fasciitis with now resolving inflammation of the heel fat pad    Impression and Recommendations:     The above documentation has been reviewed and is accurate and complete Lyndal Pulley, DO

## 2020-04-24 ENCOUNTER — Ambulatory Visit (INDEPENDENT_AMBULATORY_CARE_PROVIDER_SITE_OTHER): Payer: BC Managed Care – PPO

## 2020-04-24 ENCOUNTER — Ambulatory Visit: Payer: BC Managed Care – PPO | Admitting: Family Medicine

## 2020-04-24 ENCOUNTER — Ambulatory Visit: Payer: Self-pay

## 2020-04-24 ENCOUNTER — Other Ambulatory Visit: Payer: Self-pay

## 2020-04-24 ENCOUNTER — Encounter: Payer: Self-pay | Admitting: Family Medicine

## 2020-04-24 VITALS — BP 130/84 | HR 87 | Ht 70.0 in | Wt 184.0 lb

## 2020-04-24 DIAGNOSIS — R0789 Other chest pain: Secondary | ICD-10-CM

## 2020-04-24 DIAGNOSIS — G8929 Other chronic pain: Secondary | ICD-10-CM | POA: Diagnosis not present

## 2020-04-24 DIAGNOSIS — M79671 Pain in right foot: Secondary | ICD-10-CM | POA: Diagnosis not present

## 2020-04-24 DIAGNOSIS — R079 Chest pain, unspecified: Secondary | ICD-10-CM | POA: Diagnosis not present

## 2020-04-24 NOTE — Patient Instructions (Signed)
See me in 4-6 weeks If chest pain gets worse pulm or GI Continue exercises

## 2020-04-24 NOTE — Assessment & Plan Note (Addendum)
We have seen patient for this previously for couple years.  We discussed at this point that I would see pulmonology as well as potentially see gastroenterology if this is more of referred reflux.  Patient also given the choice to try potentially the colchicine which patient does not want to do on a regular basis.  We discussed over-the-counter medicines that we could potentially try including topical and vitamins.  These would not interact with the medicine she is on regularly.  New chest x-ray ordered today as well.  Follow-up with me again in 4 to 6 weeks patient did not want me to refer today to the specialist

## 2020-04-28 ENCOUNTER — Telehealth: Payer: Self-pay | Admitting: Family Medicine

## 2020-04-28 NOTE — Telephone Encounter (Signed)
Pt has been on her feet a lot over the holidays and is struggling with foot pain. She does not think she is ready to go to work this week and would like a few days to rest her feet.  Would like OOW note to return Thursday 05/01/2020. I advised her this might not be done today

## 2020-04-29 NOTE — Telephone Encounter (Signed)
Would she consider part time. Maybe 20 hours a week for a couple weeks?

## 2020-04-29 NOTE — Telephone Encounter (Signed)
Left VM

## 2020-05-01 ENCOUNTER — Ambulatory Visit
Admission: RE | Admit: 2020-05-01 | Discharge: 2020-05-01 | Disposition: A | Discharge status: Home / Self Care | Payer: BC Managed Care – PPO | Source: Ambulatory Visit | Attending: Family Medicine | Admitting: Family Medicine

## 2020-05-01 ENCOUNTER — Other Ambulatory Visit: Payer: Self-pay

## 2020-05-01 DIAGNOSIS — R921 Mammographic calcification found on diagnostic imaging of breast: Secondary | ICD-10-CM

## 2020-05-01 NOTE — Telephone Encounter (Signed)
Left VM

## 2020-05-28 NOTE — Progress Notes (Signed)
[place] Phone: [phone] Subjective:   [name], am serving as a scribe for Dr. Hulan Saas. This visit occurred during the SARS-CoV-2 public health emergency.  Safety protocols were in place, including screening questions prior to the visit, additional usage of staff PPE, and extensive cleaning of exam room while observing appropriate contact time as indicated for disinfecting solutions.   I'm seeing this patient by the request  of:  Harlan Stains, MD  CC: Right heel pain follow-up  DVV:[id number]   04/24/2020 We have seen patient for this previously for couple years.  We discussed at this point that I would see pulmonology as well as potentially see gastroenterology if this is more of referred reflux.  Patient also given the choice to try potentially the colchicine which patient does not want to do on a regular basis.  We discussed over-the-counter medicines that we could potentially try including topical and vitamins.  These would not interact with the medicine she is on regularly.  New chest x-ray ordered today as well.  Follow-up with me again in 4 to 6 weeks patient did not want me to refer today to the specialist  Update 05/29/2020 Jayln Branscom is a 55 y.o. female coming in with complaint of right heel pain. Patient states that her pain is the same as last visit. Pain improves when she is not on her feet. Pain can be sharp when she first steps out of bed. Is using airheel daily.      Past Medical History:  Diagnosis Date  . Asthma   . Endometriosis   . Environmental allergies   . Hypercholesteremia    Past Surgical History:  Procedure Laterality Date  . KELOID EXCISION    . TONSILLECTOMY     Social History   Socioeconomic History  . Marital status: Single    Spouse name: Not on file  . Number of children: Not on file  . Years of education: Not on file  . Highest education  level: Not on file  Occupational History  . Occupation: Electrical engineer  Tobacco Use  . Smoking status: Never Smoker  . Smokeless tobacco: Never Used  Substance and Sexual Activity  . Alcohol use: No  . Drug use: No  . Sexual activity: Not Currently    Birth control/protection: None  Other Topics Concern  . Not on file  Social History Narrative  . Not on file   Social Determinants of Health   Financial Resource Strain: Not on file  Food Insecurity: Not on file  Transportation Needs: Not on file  Physical Activity: Not on file  Stress: Not on file  Social Connections: Not on file   Allergies  Allergen Reactions  . Anaprox [Naproxen] Itching and Swelling  . Asa [Aspirin] Swelling  . Ibuprofen Itching and Swelling  . Other Itching and Swelling    Darvocet-N   . Sulindac Itching and Swelling   Family History  Problem Relation Age of Onset  . Hypertension Father   . Skin cancer Father   . Hypertension Mother   . Hypertension Sister   . Diabetes Sister   . Congestive Heart Failure Sister   . CAD Sister   . Breast cancer Maternal Aunt   . Breast cancer Cousin        in 50's    Current Outpatient Medications (Endocrine & Metabolic):  .  norethindrone (AYGESTIN) 5 MG tablet, Take 1 tablet (5  mg total) by mouth 3 (three) times daily. .  predniSONE (DELTASONE) 10 MG tablet, Take 10 mg by mouth daily with breakfast.  Current Outpatient Medications (Cardiovascular):  .  amLODipine (NORVASC) 5 MG tablet, Take 5 mg by mouth daily.  .  hydrALAZINE (APRESOLINE) 10 MG tablet, Take 1 tablet (10 mg total) by mouth 4 (four) times daily. .  rosuvastatin (CRESTOR) 5 MG tablet, TAKE 1 TABLET BY MOUTH TWICE A WEEK FOR 30 DAYS .  triamterene-hydrochlorothiazide (MAXZIDE-25) 37.5-25 MG tablet, Take 1 tablet by mouth daily.   Current Outpatient Medications (Respiratory):  .  albuterol (VENTOLIN HFA) 108 (90 Base) MCG/ACT inhaler, Inhale 1-2 puffs into the lungs every 6 (six) hours as  needed for wheezing or shortness of breath.   Current Outpatient Medications (Analgesics):  .  colchicine 0.6 MG tablet, Take 1 tablet (0.6 mg total) by mouth 2 (two) times daily. Marland Kitchen  HYDROcodone-acetaminophen (NORCO/VICODIN) 5-325 MG tablet, hydrocodone 5 mg-acetaminophen 325 mg tablet   Current Outpatient Medications (Other):  .  acyclovir (ZOVIRAX) 400 MG tablet, Take 1 tablet (400 mg total) by mouth 3 (three) times daily. Marland Kitchen  CALCIUM PO, Take by mouth daily. .  cyclobenzaprine (FLEXERIL) 10 MG tablet, cyclobenzaprine 10 mg tablet .  Diclofenac Sodium 2 % SOLN, Place 2 g onto the skin 2 (two) times daily. .  Diclofenac Sodium 2 % SOLN, Place 2 g onto the skin 2 (two) times daily. Marland Kitchen  esomeprazole (NEXIUM) 40 MG capsule, Take 1 capsule (40 mg total) by mouth daily. .  MULTIPLE VITAMIN PO, Take by mouth. .  potassium chloride SA (KLOR-CON) 20 MEQ tablet, Take 1 tablet (20 mEq total) by mouth daily. Marland Kitchen  PRESCRIPTION MEDICATION,  .  Vitamin D, Ergocalciferol, (DRISDOL) 1.25 MG (50000 UT) CAPS capsule, Take 1 capsule (50,000 Units total) by mouth every 7 (seven) days.   Reviewed prior external information including notes and imaging from  primary care provider As well as notes that were available from care everywhere and other healthcare systems.  Past medical history, social, surgical and family history all reviewed in electronic medical record.  No pertanent information unless stated regarding to the chief complaint.   Review of Systems:  No headache, visual changes, nausea, vomiting, diarrhea, constipation, dizziness, abdominal pain, skin rash, fevers, chills, night sweats, weight loss, swollen lymph nodes, body aches, joint swelling, chest pain, shortness of breath, mood changes. POSITIVE muscle aches  Objective  Blood pressure 120/72, pulse 71, height 5\' 10"  (1.778 m), weight 185 lb (83.9 kg), SpO2 97 %.   General: No apparent distress alert and oriented x3 mood and affect normal,  dressed appropriately.  HEENT: Pupils equal, extraocular movements intact  Respiratory: Patient's speak in full sentences and does not appear short of breath  Cardiovascular: No lower extremity edema, non tender, no erythema  MSK: Right heel shows the patient does have some loss of longitudinal arch.  Still tender to palpation on the plantar aspect of the heel.  Patient does have mild tightness of the posterior cord.  Mild overpronation of the hindfoot with standing. Neurovascularly intact distally.  Limited musculoskeletal ultrasound was performed and interpreted by Lyndal Pulley  Limited ultrasound of the patient's right heel shows the patient still has what appears to be more soft tissue inflammation.  Patient does have very mild enlargement of the plantar fascia.  No significant cortical irregularity noted though of the origin of the plantar fascia. Impression: Relatively constant inflammation of the fat pad with mild plantar fasciitis  Impression and Recommendations:     The above documentation has been reviewed and is accurate and complete Lyndal Pulley, DO

## 2020-05-29 ENCOUNTER — Ambulatory Visit (INDEPENDENT_AMBULATORY_CARE_PROVIDER_SITE_OTHER): Payer: BC Managed Care – PPO

## 2020-05-29 ENCOUNTER — Ambulatory Visit: Payer: Self-pay

## 2020-05-29 ENCOUNTER — Ambulatory Visit: Payer: BC Managed Care – PPO | Admitting: Family Medicine

## 2020-05-29 ENCOUNTER — Encounter: Payer: Self-pay | Admitting: Family Medicine

## 2020-05-29 ENCOUNTER — Other Ambulatory Visit: Payer: Self-pay

## 2020-05-29 VITALS — BP 120/72 | HR 71 | Ht 70.0 in | Wt 185.0 lb

## 2020-05-29 DIAGNOSIS — M79671 Pain in right foot: Secondary | ICD-10-CM | POA: Diagnosis not present

## 2020-05-29 NOTE — Patient Instructions (Signed)
Good to see you Try TENS unit Spenco Total Support Orthotics HOKA and OOFOS in the house Do exercises regularly Ice at night If no improvement, consider PT, MRI or injection Xray today See me again in 6-8 weeks

## 2020-05-29 NOTE — Assessment & Plan Note (Signed)
Patient does have the inflammatory heel pain still.  Patient has been somewhat noncompliant.  I do think that patient does have some mild plantar fasciitis.  We will get x-rays today.  Patient will continue with the brace, we discussed over-the-counter orthotics, other shoes that would help in the house as well.  Patient declined anything such as physical therapy, MRI or injection today.  Encouraged her to do the exercise more regularly and follow-up with me again in 6 to 8 weeks

## 2020-07-30 NOTE — Progress Notes (Deleted)
[place] [address] [place] Phone: [phone] Subjective:    I'm seeing this patient by the request  of:  Harlan Stains, MD  CC:   [id number]   05/29/2020 Patient does have the inflammatory heel pain still.  Patient has been somewhat noncompliant.  I do think that patient does have some mild plantar fasciitis.  We will get x-rays today.  Patient will continue with the brace, we discussed over-the-counter orthotics, other shoes that would help in the house as well.  Patient declined anything such as physical therapy, MRI or injection today.  Encouraged her to do the exercise more regularly and follow-up with me again in 6 to 8 weeks   Update 07/31/2020 Zyann Mabry is a 55 y.o. female coming in with complaint of right heel pain.   Onset-  Location Duration-  Character- Aggravating factors- Reliving factors-  Therapies tried-  Severity-     Past Medical History:  Diagnosis Date  . Asthma   . Endometriosis   . Environmental allergies   . Hypercholesteremia    Past Surgical History:  Procedure Laterality Date  . KELOID EXCISION    . TONSILLECTOMY     Social History   Socioeconomic History  . Marital status: Single    Spouse name: Not on file  . Number of children: Not on file  . Years of education: Not on file  . Highest education level: Not on file  Occupational History  . Occupation: Electrical engineer  Tobacco Use  . Smoking status: Never Smoker  . Smokeless tobacco: Never Used  Substance and Sexual Activity  . Alcohol use: No  . Drug use: No  . Sexual activity: Not Currently    Birth control/protection: None  Other Topics Concern  . Not on file  Social History Narrative  . Not on file   Social Determinants of Health   Financial Resource Strain: Not on file  Food Insecurity: Not on file  Transportation Needs: Not on file  Physical Activity: Not on file  Stress: Not on file  Social  Connections: Not on file   Allergies  Allergen Reactions  . Anaprox [Naproxen] Itching and Swelling  . Asa [Aspirin] Swelling  . Ibuprofen Itching and Swelling  . Other Itching and Swelling    Darvocet-N   . Sulindac Itching and Swelling   Family History  Problem Relation Age of Onset  . Hypertension Father   . Skin cancer Father   . Hypertension Mother   . Hypertension Sister   . Diabetes Sister   . Congestive Heart Failure Sister   . CAD Sister   . Breast cancer Maternal Aunt   . Breast cancer Cousin        in 50's    Current Outpatient Medications (Endocrine & Metabolic):  .  norethindrone (AYGESTIN) 5 MG tablet, Take 1 tablet (5 mg total) by mouth 3 (three) times daily. .  predniSONE (DELTASONE) 10 MG tablet, Take 10 mg by mouth daily with breakfast.  Current Outpatient Medications (Cardiovascular):  .  amLODipine (NORVASC) 5 MG tablet, Take 5 mg by mouth daily.  .  hydrALAZINE (APRESOLINE) 10 MG tablet, Take 1 tablet (10 mg total) by mouth 4 (four) times daily. .  rosuvastatin (CRESTOR) 5 MG tablet, TAKE 1 TABLET BY MOUTH TWICE A WEEK FOR 30 DAYS .  triamterene-hydrochlorothiazide (MAXZIDE-25) 37.5-25 MG tablet, Take 1 tablet by mouth daily.   Current Outpatient Medications (Respiratory):  .  albuterol (VENTOLIN  HFA) 108 (90 Base) MCG/ACT inhaler, Inhale 1-2 puffs into the lungs every 6 (six) hours as needed for wheezing or shortness of breath.   Current Outpatient Medications (Analgesics):  .  colchicine 0.6 MG tablet, Take 1 tablet (0.6 mg total) by mouth 2 (two) times daily. Marland Kitchen  HYDROcodone-acetaminophen (NORCO/VICODIN) 5-325 MG tablet, hydrocodone 5 mg-acetaminophen 325 mg tablet   Current Outpatient Medications (Other):  .  acyclovir (ZOVIRAX) 400 MG tablet, Take 1 tablet (400 mg total) by mouth 3 (three) times daily. Marland Kitchen  CALCIUM PO, Take by mouth daily. .  cyclobenzaprine (FLEXERIL) 10 MG tablet, cyclobenzaprine 10 mg tablet .  Diclofenac Sodium 2 % SOLN, Place  2 g onto the skin 2 (two) times daily. .  Diclofenac Sodium 2 % SOLN, Place 2 g onto the skin 2 (two) times daily. Marland Kitchen  esomeprazole (NEXIUM) 40 MG capsule, Take 1 capsule (40 mg total) by mouth daily. .  MULTIPLE VITAMIN PO, Take by mouth. .  potassium chloride SA (KLOR-CON) 20 MEQ tablet, Take 1 tablet (20 mEq total) by mouth daily. Marland Kitchen  PRESCRIPTION MEDICATION,  .  Vitamin D, Ergocalciferol, (DRISDOL) 1.25 MG (50000 UT) CAPS capsule, Take 1 capsule (50,000 Units total) by mouth every 7 (seven) days.   Reviewed prior external information including notes and imaging from  primary care provider As well as notes that were available from care everywhere and other healthcare systems.  Past medical history, social, surgical and family history all reviewed in electronic medical record.  No pertanent information unless stated regarding to the chief complaint.   Review of Systems:  No headache, visual changes, nausea, vomiting, diarrhea, constipation, dizziness, abdominal pain, skin rash, fevers, chills, night sweats, weight loss, swollen lymph nodes, body aches, joint swelling, chest pain, shortness of breath, mood changes. POSITIVE muscle aches  Objective  There were no vitals taken for this visit.   General: No apparent distress alert and oriented x3 mood and affect normal, dressed appropriately.  HEENT: Pupils equal, extraocular movements intact  Respiratory: Patient's speak in full sentences and does not appear short of breath  Cardiovascular: No lower extremity edema, non tender, no erythema  Gait normal with good balance and coordination.  MSK:  Non tender with full range of motion and good stability and symmetric strength and tone of shoulders, elbows, wrist, hip, knee and ankles bilaterally.     Impression and Recommendations:     The above documentation has been reviewed and is accurate and complete Jacqualin Combes

## 2020-07-31 ENCOUNTER — Ambulatory Visit: Payer: BC Managed Care – PPO | Admitting: Family Medicine

## 2020-09-08 ENCOUNTER — Ambulatory Visit: Payer: Self-pay | Attending: Internal Medicine

## 2020-09-08 DIAGNOSIS — Z20822 Contact with and (suspected) exposure to covid-19: Secondary | ICD-10-CM | POA: Insufficient documentation

## 2020-09-09 LAB — NOVEL CORONAVIRUS, NAA: SARS-CoV-2, NAA: DETECTED — AB

## 2020-09-09 LAB — SARS-COV-2, NAA 2 DAY TAT

## 2020-09-24 ENCOUNTER — Ambulatory Visit
Admission: RE | Admit: 2020-09-24 | Discharge: 2020-09-24 | Disposition: A | Discharge status: Home / Self Care | Payer: BC Managed Care – PPO | Source: Ambulatory Visit | Attending: Family Medicine | Admitting: Family Medicine

## 2020-09-24 ENCOUNTER — Other Ambulatory Visit: Payer: Self-pay | Admitting: Family Medicine

## 2020-09-24 DIAGNOSIS — U099 Post covid-19 condition, unspecified: Secondary | ICD-10-CM

## 2020-09-24 DIAGNOSIS — R053 Chronic cough: Secondary | ICD-10-CM

## 2020-10-16 ENCOUNTER — Encounter: Payer: Self-pay | Admitting: Pulmonary Disease

## 2020-10-16 ENCOUNTER — Other Ambulatory Visit: Payer: Self-pay

## 2020-10-16 ENCOUNTER — Ambulatory Visit: Payer: BC Managed Care – PPO | Admitting: Pulmonary Disease

## 2020-10-16 VITALS — BP 146/80 | HR 88 | Temp 98.1°F | Ht 70.0 in | Wt 184.4 lb

## 2020-10-16 DIAGNOSIS — R059 Cough, unspecified: Secondary | ICD-10-CM

## 2020-10-16 DIAGNOSIS — J4541 Moderate persistent asthma with (acute) exacerbation: Secondary | ICD-10-CM | POA: Diagnosis not present

## 2020-10-16 LAB — CBC WITH DIFFERENTIAL/PLATELET
Basophils Absolute: 0.1 10*3/uL (ref 0.0–0.1)
Basophils Relative: 1.2 % (ref 0.0–3.0)
Eosinophils Absolute: 0.4 10*3/uL (ref 0.0–0.7)
Eosinophils Relative: 7.1 % — ABNORMAL HIGH (ref 0.0–5.0)
HCT: 43.2 % (ref 36.0–46.0)
Hemoglobin: 14.5 g/dL (ref 12.0–15.0)
Lymphocytes Relative: 26.8 % (ref 12.0–46.0)
Lymphs Abs: 1.6 10*3/uL (ref 0.7–4.0)
MCHC: 33.5 g/dL (ref 30.0–36.0)
MCV: 89.4 fl (ref 78.0–100.0)
Monocytes Absolute: 0.5 10*3/uL (ref 0.1–1.0)
Monocytes Relative: 8.6 % (ref 3.0–12.0)
Neutro Abs: 3.4 10*3/uL (ref 1.4–7.7)
Neutrophils Relative %: 56.3 % (ref 43.0–77.0)
Platelets: 331 10*3/uL (ref 150.0–400.0)
RBC: 4.83 Mil/uL (ref 3.87–5.11)
RDW: 15 % (ref 11.5–15.5)
WBC: 6 10*3/uL (ref 4.0–10.5)

## 2020-10-16 MED ORDER — TRELEGY ELLIPTA 200-62.5-25 MCG/INH IN AEPB: 1.0000 | INHALATION_SPRAY | Freq: Every day | RESPIRATORY_TRACT | 2 refills | Status: DC

## 2020-10-16 MED ORDER — MONTELUKAST SODIUM 10 MG PO TABS: 10.0000 mg | ORAL_TABLET | Freq: Every day | ORAL | 5 refills | Status: DC

## 2020-10-16 MED ORDER — TRELEGY ELLIPTA 200-62.5-25 MCG/INH IN AEPB
1.0000 | INHALATION_SPRAY | Freq: Every day | RESPIRATORY_TRACT | 0 refills | Status: DC
Start: 2020-10-16 — End: 2020-11-28

## 2020-10-16 NOTE — Progress Notes (Signed)
Daisy Sawyer    [id number]    10-Feb-1966  Primary Care Physician:White, Caren Griffins, MD  Referring Physician: Harlan Stains, MD [place],  [place] [address]  Chief complaint:  Consult for cough, asthma  HPI: 55 year old with history of childhood asthma.  Developed COVID-19 on 09/10/2020.  Should not require hospitalization Complains of persistent cough, dyspnea since then.  No wheezing  She has been tried with Qvar but started having blisters.  She is also given Symbicort which does not seem to be helping She has been treated extensively at primary care with multiple rounds of antibiotics including Augmentin, Z-Pak and multiple rounds of prednisone, Tessalon, codeine cough syrup without improvement.  At last visit she was given Prilosec which did not help  Chest x-ray at primary care showed possible basal atelectasis.  Follows with Dr. 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  No feather pillows or comforters Smoking history: Never smoked Travel history: No significant travel history Relevant family history: No family history of lung disease  Outpatient Encounter Medications as of 10/16/2020  Medication Sig   albuterol (VENTOLIN HFA) 108 (90 Base) MCG/ACT inhaler Inhale 1-2 puffs into the lungs every 6 (six) hours as needed for wheezing or shortness of breath.    amLODipine (NORVASC) 5 MG tablet Take 5 mg by mouth daily.    beclomethasone (QVAR) 40 MCG/ACT inhaler Inhale 1 puff into the lungs 2 (two) times daily.   benzonatate (TESSALON) 200 MG capsule Take 200 mg by mouth 3 (three) times daily as needed for cough.   cyclobenzaprine (FLEXERIL) 10 MG tablet cyclobenzaprine 10 mg tablet   Diclofenac Sodium 2 % SOLN Place 2 g onto the skin 2 (two) times daily.   diphenhydrAMINE (BENADRYL) 50 MG tablet Take 25 mg by mouth at bedtime as needed for itching.    ezetimibe (ZETIA) 10 MG tablet Take 10 mg by mouth daily.   hydrALAZINE (APRESOLINE) 10 MG tablet Take 1 tablet (10 mg total) by mouth 4 (four) times daily.   HYDROcodone-acetaminophen (NORCO/VICODIN) 5-325 MG tablet hydrocodone 5 mg-acetaminophen 325 mg tablet   omeprazole (PRILOSEC) 40 MG capsule Take 40 mg by mouth daily.   triamcinolone (NASACORT ALLERGY 24HR) 55 MCG/ACT AERO nasal inhaler Place 2 sprays into the nose daily.   triamterene-hydrochlorothiazide (MAXZIDE-25) 37.5-25 MG tablet Take 1 tablet by mouth daily.    Vitamin D, Ergocalciferol, (DRISDOL) 1.25 MG (50000 UT) CAPS capsule Take 1 capsule (50,000 Units total) by mouth every 7 (seven) days.   MULTIPLE VITAMIN PO Take by mouth.   norethindrone (AYGESTIN) 5 MG tablet Take 1 tablet (5 mg total) by mouth 3 (three) times daily.   potassium chloride SA (KLOR-CON) 20 MEQ tablet Take 1 tablet (20 mEq total) by mouth daily.   PRESCRIPTION MEDICATION    [DISCONTINUED] acyclovir (ZOVIRAX) 400 MG tablet Take 1 tablet (400 mg total) by mouth 3 (three) times daily. (Patient not taking: Reported on 10/16/2020)   [DISCONTINUED] CALCIUM PO Take by mouth daily. (Patient not taking: Reported on 10/16/2020)   [DISCONTINUED] colchicine 0.6 MG tablet Take 1 tablet (0.6 mg total) by mouth 2 (two) times daily. (Patient not taking: Reported on 10/16/2020)   [DISCONTINUED] Diclofenac Sodium 2 % SOLN Place 2 g onto the skin 2 (two) times daily. (Patient not taking: Reported on 10/16/2020)   [DISCONTINUED] esomeprazole (NEXIUM) 40 MG capsule  Take 1 capsule (40 mg total) by mouth daily. (Patient not taking: Reported on 10/16/2020)   [DISCONTINUED] predniSONE (DELTASONE) 10 MG tablet Take 10 mg by mouth daily with breakfast. (Patient not taking: Reported on 10/16/2020)   [DISCONTINUED] rosuvastatin (CRESTOR) 5 MG tablet TAKE 1 TABLET BY MOUTH TWICE A WEEK FOR 30 DAYS (Patient not taking: Reported on 10/16/2020)   No facility-administered encounter medications on  file as of 10/16/2020.   Physical Exam: Blood pressure (!) 146/80, pulse 88, temperature 98.1 F (36.7 C), temperature source Temporal, height 5\' 10"  (1.778 m), weight 184 lb 6.4 oz (83.6 kg), SpO2 98 %. Gen:      No acute distress HEENT:  EOMI, sclera anicteric Neck:     No masses; no thyromegaly Lungs:    Clear to auscultation bilaterally; normal respiratory effort CV:         Regular rate and rhythm; no murmurs Abd:      + bowel sounds; soft, non-tender; no palpable masses, no distension Ext:    No edema; adequate peripheral perfusion Skin:      Warm and dry; no rash Neuro: alert and oriented x 3 Psych: normal mood and affect  Data Reviewed: Imaging: Chest x-ray 09/24/2020-minimal right basilar atelectasis I have reviewed the images personally  PFTs:  Labs:  Assessment:  Cough, asthma Symptoms have exacerbated after recent COVID-19 infection She has been treated extensively for cough with codeine cough syrup, Tessalon, multiple rounds of antibiotics and prednisone without improvement  Suspect postnasal drip and GERD is playing a role in presentation We will change Symbicort to Trelegy Start Singulair Continue Flonase Prilosec 40 mg twice daily  Schedule high-res CT and PFTs to assess for post-COVID ILD  Plan/Recommendations: Change Symbicort to Trelegy Singulair, Flonase Prilosec 40 mg twice daily High-res CT, PFTs  Marshell Garfinkel MD [facility] 10/16/2020, 10:42 AM  CC: Harlan Stains, MD

## 2020-10-16 NOTE — Addendum Note (Signed)
Addended by: Elton Sin on: 10/16/2020 11:16 AM   Modules accepted: Orders

## 2020-10-16 NOTE — Patient Instructions (Signed)
We will start you on a medication called Trelegy 200, use this inhaler instead of the Symbicort and the Qvar Start Singulair at night Start taking the Prilosec 40 mg twice daily Continue using the Nasonex  Check CBC differential, IgE today Schedule high-res CT and PFTs  Follow-up in 1 to 2 months

## 2020-10-16 NOTE — Addendum Note (Signed)
Addended by: Suzzanne Cloud E on: 10/16/2020 11:14 AM   Modules accepted: Orders

## 2020-10-16 NOTE — Addendum Note (Signed)
Addended by: Elton Sin on: 10/16/2020 12:12 PM   Modules accepted: Orders

## 2020-10-17 LAB — IGE: IgE (Immunoglobulin E), Serum: 389 kU/L — ABNORMAL HIGH (ref <=114)

## 2020-10-27 ENCOUNTER — Institutional Professional Consult (permissible substitution): Payer: BC Managed Care – PPO | Admitting: Pulmonary Disease

## 2020-10-28 ENCOUNTER — Encounter: Payer: Self-pay | Admitting: *Deleted

## 2020-11-06 ENCOUNTER — Other Ambulatory Visit: Payer: Self-pay

## 2020-11-06 ENCOUNTER — Ambulatory Visit
Admission: RE | Admit: 2020-11-06 | Discharge: 2020-11-06 | Disposition: A | Discharge status: Home / Self Care | Payer: BC Managed Care – PPO | Source: Ambulatory Visit | Attending: Pulmonary Disease | Admitting: Pulmonary Disease

## 2020-11-06 DIAGNOSIS — R059 Cough, unspecified: Secondary | ICD-10-CM

## 2020-11-28 ENCOUNTER — Telehealth: Payer: Self-pay | Admitting: Pulmonary Disease

## 2020-11-28 ENCOUNTER — Encounter: Payer: Self-pay | Admitting: Pulmonary Disease

## 2020-11-28 ENCOUNTER — Other Ambulatory Visit: Payer: Self-pay

## 2020-11-28 ENCOUNTER — Ambulatory Visit: Payer: BC Managed Care – PPO | Admitting: Pulmonary Disease

## 2020-11-28 ENCOUNTER — Ambulatory Visit (INDEPENDENT_AMBULATORY_CARE_PROVIDER_SITE_OTHER): Payer: BC Managed Care – PPO | Admitting: Pulmonary Disease

## 2020-11-28 VITALS — BP 130/72 | HR 81 | Temp 98.9°F | Ht 69.75 in | Wt 188.2 lb

## 2020-11-28 DIAGNOSIS — R059 Cough, unspecified: Secondary | ICD-10-CM

## 2020-11-28 DIAGNOSIS — J4541 Moderate persistent asthma with (acute) exacerbation: Secondary | ICD-10-CM

## 2020-11-28 LAB — PULMONARY FUNCTION TEST
DL/VA % pred: 134 %
DL/VA: 5.52 ml/min/mmHg/L
DLCO cor % pred: 107 %
DLCO cor: 26.66 ml/min/mmHg
DLCO unc % pred: 110 %
DLCO unc: 27.52 ml/min/mmHg
FEF 25-75 Post: 3.5 L/sec
FEF 25-75 Pre: 3.29 L/sec
FEF2575-%Change-Post: 6 %
FEF2575-%Pred-Post: 130 %
FEF2575-%Pred-Pre: 122 %
FEV1-%Change-Post: 2 %
FEV1-%Pred-Post: 97 %
FEV1-%Pred-Pre: 95 %
FEV1-Post: 2.68 L
FEV1-Pre: 2.61 L
FEV1FVC-%Change-Post: 4 %
FEV1FVC-%Pred-Pre: 104 %
FEV6-%Change-Post: -2 %
FEV6-%Pred-Post: 90 %
FEV6-%Pred-Pre: 92 %
FEV6-Post: 3.06 L
FEV6-Pre: 3.12 L
FEV6FVC-%Pred-Post: 102 %
FEV6FVC-%Pred-Pre: 102 %
FVC-%Change-Post: -2 %
FVC-%Pred-Post: 88 %
FVC-%Pred-Pre: 90 %
FVC-Post: 3.06 L
FVC-Pre: 3.12 L
Post FEV1/FVC ratio: 88 %
Post FEV6/FVC ratio: 100 %
Pre FEV1/FVC ratio: 84 %
Pre FEV6/FVC Ratio: 100 %
RV % pred: 84 %
RV: 1.83 L
TLC % pred: 86 %
TLC: 5.11 L

## 2020-11-28 MED ORDER — ESOMEPRAZOLE MAGNESIUM 40 MG PO CPDR: 40.0000 mg | DELAYED_RELEASE_CAPSULE | Freq: Every day | ORAL | 5 refills | Status: DC

## 2020-11-28 MED ORDER — ESOMEPRAZOLE MAGNESIUM 40 MG PO CPDR: 40.0000 mg | DELAYED_RELEASE_CAPSULE | Freq: Every day | ORAL | 5 refills | Status: AC

## 2020-11-28 MED ORDER — CHLORPHENIRAMINE MALEATE 4 MG PO TABS: 4.0000 mg | ORAL_TABLET | Freq: Three times a day (TID) | ORAL | 0 refills | Status: AC

## 2020-11-28 NOTE — Patient Instructions (Addendum)
I have reviewed your CT and PFTs which were normal.  There are no lung abnormalities  We will stop the inhalers as they are not helping Discontinue Singulair Stop Prilosec  Start esomeprazole 40 mg daily.  We will send in a prescription Send in a prescription for chlorpheniramine 4 mg 3 times daily.  This medication is available over-the-counter as well  Follow-up in 6 months.

## 2020-11-28 NOTE — Telephone Encounter (Signed)
Left message informing patient medication has been sent to preferred pharmacy.   Nothing further needed at this time.

## 2020-11-28 NOTE — Progress Notes (Signed)
Daisy Sawyer    [id number]    [birth year]  Primary Care Physician:White, Caren Griffins, MD  Referring Physician: Harlan Stains, MD [place],   [id number]  Chief complaint: Follow-up for cough HPI: 55 year old with history of childhood asthma.  Developed COVID-19 on 09/10/2020.  Should not require hospitalization Complains of persistent cough, dyspnea since then.  No wheezing  She has been tried with Qvar but started having blisters.  She is also given Symbicort which does not seem to be helping She has been treated extensively at primary care with multiple rounds of antibiotics including Augmentin, Z-Pak and multiple rounds of prednisone, Tessalon, codeine cough syrup without improvement.  At last visit she was given Prilosec which did not help  Chest x-ray at primary care showed possible basal atelectasis.  Follows with Dr. 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  No feather pillows or comforters Smoking history: Never smoked Travel history: No significant travel history Relevant family history: No family history of lung disease  Interim history: Tried on Trelegy at last visit.  This has not helped any and she stopped taking intermittently basis Singulair and Prilosec gave her rash and it was stopped after visit to dermatology  She is using an old prescription of esomeprazole and Flonase Continues to have significant cough Here for review of CT and PFTs  Outpatient Encounter Medications as of 11/28/2020  Medication Sig   albuterol (VENTOLIN HFA) 108 (90 Base) MCG/ACT inhaler Inhale 1-2 puffs into the lungs every 6 (six) hours as needed for wheezing or shortness of breath.    amLODipine (NORVASC) 5 MG tablet Take 5 mg by mouth daily.    beclomethasone (QVAR) 40 MCG/ACT inhaler Inhale 1 puff into the lungs 2 (two) times daily.   cetirizine  (ZYRTEC) 10 MG tablet Take 10 mg by mouth daily.   cyclobenzaprine (FLEXERIL) 10 MG tablet cyclobenzaprine 10 mg tablet   Diclofenac Sodium 2 % SOLN Place 2 g onto the skin 2 (two) times daily.   esomeprazole (NEXIUM) 40 MG capsule Take 40 mg by mouth daily at 12 noon.   Fluticasone-Umeclidin-Vilant (TRELEGY ELLIPTA) 200-62.5-25 MCG/INH AEPB Inhale 1 puff into the lungs daily.   hydrALAZINE (APRESOLINE) 10 MG tablet Take 1 tablet (10 mg total) by mouth 4 (four) times daily.   MULTIPLE VITAMIN PO Take by mouth.   norethindrone (AYGESTIN) 5 MG tablet Take 1 tablet (5 mg total) by mouth 3 (three) times daily.   PRESCRIPTION MEDICATION    triamcinolone (NASACORT) 55 MCG/ACT AERO nasal inhaler Place 2 sprays into the nose daily.   triamterene-hydrochlorothiazide (MAXZIDE-25) 37.5-25 MG tablet Take 1 tablet by mouth daily.    Vitamin D, Ergocalciferol, (DRISDOL) 1.25 MG (50000 UT) CAPS capsule Take 1 capsule (50,000 Units total) by mouth every 7 (seven) days.   ezetimibe (ZETIA) 10 MG tablet Take 10 mg by mouth daily.   omeprazole (PRILOSEC) 40 MG capsule Take 40 mg by mouth 2 (two) times daily. (Patient not taking: Reported on 11/28/2020)   [DISCONTINUED] benzonatate (TESSALON) 200 MG capsule Take 200 mg by mouth 3 (three) times daily as needed for cough. (Patient not taking: Reported on 11/28/2020)   [DISCONTINUED] diphenhydrAMINE (BENADRYL) 50 MG tablet Take 25 mg by mouth at bedtime as needed for itching.   [DISCONTINUED] Fluticasone-Umeclidin-Vilant (TRELEGY ELLIPTA) 200-62.5-25 MCG/INH AEPB Inhale 1 puff into the lungs  daily.   [DISCONTINUED] HYDROcodone-acetaminophen (NORCO/VICODIN) 5-325 MG tablet hydrocodone 5 mg-acetaminophen 325 mg tablet   [DISCONTINUED] montelukast (SINGULAIR) 10 MG tablet Take 1 tablet (10 mg total) by mouth at bedtime.   [DISCONTINUED] potassium chloride SA (KLOR-CON) 20 MEQ tablet Take 1 tablet (20 mEq total) by mouth daily.   No facility-administered encounter  medications on file as of 11/28/2020.   Physical Exam: Blood pressure (!) 146/80, pulse 88, temperature 98.1 F (36.7 C), temperature source Temporal, height '5\' 10"'$  (1.778 m), weight 184 lb 6.4 oz (83.6 kg), SpO2 98 %. Gen:      No acute distress HEENT:  EOMI, sclera anicteric Neck:     No masses; no thyromegaly Lungs:    Clear to auscultation bilaterally; normal respiratory effort CV:         Regular rate and rhythm; no murmurs Abd:      + bowel sounds; soft, non-tender; no palpable masses, no distension Ext:    No edema; adequate peripheral perfusion Skin:      Warm and dry; no rash Neuro: alert and oriented x 3 Psych: normal mood and affect  Data Reviewed: Imaging: Chest x-ray 09/24/2020- minimal right basilar atelectasis High-res CT chest 10/31/2020-no evidence of interstitial lung disease, mild biapical scarring, stable left adrenal adenoma I have reviewed the images personally.  PFTs: 11/28/2020 FVC 3.06 (88%), FEV1 2.68 [97%], F/F 88, TLC 5.11 [86%], DLCO 27.52 (110%] Normal test  Labs:  Assessment:  Cough, asthma Symptoms have exacerbated after recent COVID-19 infection She has been treated extensively for cough with codeine cough syrup, Tessalon, multiple rounds of antibiotics and prednisone without improvement  Suspect postnasal drip and GERD is playing a role in presentation I have reviewed CT and PFTs which were normal.  There are no lung abnormalities We will stop the inhalers as they are not helping Discontinue Singulair Stop Prilosec  Start esomeprazole 40 mg daily.  We will send in a prescription Send in a prescription for chlorpheniramine 4 mg 3 times daily.  This medication is available over-the-counter as well  Plan/Recommendations: DC inhalers and Singulair Start esomeprazole instead of Prilosec Try chlorpheniramine for postnasal drip  Marshell Garfinkel MD  Pulmonary and Critical Care 11/28/2020, 10:18 AM  CC: Harlan Stains, MD

## 2020-11-28 NOTE — Progress Notes (Signed)
PFT done today. 

## 2021-02-16 ENCOUNTER — Ambulatory Visit: Payer: Self-pay

## 2021-02-16 ENCOUNTER — Other Ambulatory Visit: Payer: Self-pay | Admitting: Family Medicine

## 2021-02-16 ENCOUNTER — Other Ambulatory Visit: Payer: Self-pay

## 2021-02-16 ENCOUNTER — Ambulatory Visit
Admission: RE | Admit: 2021-02-16 | Discharge: 2021-02-16 | Disposition: A | Discharge status: Home / Self Care | Payer: BC Managed Care – PPO | Source: Ambulatory Visit | Attending: Family Medicine | Admitting: Family Medicine

## 2021-02-16 DIAGNOSIS — M545 Low back pain, unspecified: Secondary | ICD-10-CM

## 2021-04-10 ENCOUNTER — Other Ambulatory Visit: Payer: Self-pay | Admitting: Family Medicine

## 2021-04-10 DIAGNOSIS — Z1231 Encounter for screening mammogram for malignant neoplasm of breast: Secondary | ICD-10-CM

## 2021-05-13 ENCOUNTER — Ambulatory Visit: Payer: BC Managed Care – PPO

## 2021-05-14 ENCOUNTER — Ambulatory Visit
Admission: RE | Admit: 2021-05-14 | Discharge: 2021-05-14 | Disposition: A | Discharge status: Home / Self Care | Payer: BC Managed Care – PPO | Source: Ambulatory Visit | Attending: Family Medicine | Admitting: Family Medicine

## 2021-05-14 ENCOUNTER — Other Ambulatory Visit: Payer: Self-pay

## 2021-05-14 DIAGNOSIS — Z1231 Encounter for screening mammogram for malignant neoplasm of breast: Secondary | ICD-10-CM

## 2021-05-23 IMAGING — DX DG ANKLE COMPLETE 3+V*R*
3 series · 3 of 3 positions shown · non-contrast
Comparison: None.

CLINICAL DATA: Chronic heel pain.  No known injury.

EXAM:
RIGHT ANKLE - COMPLETE 3+ VIEW

[ankle ap]
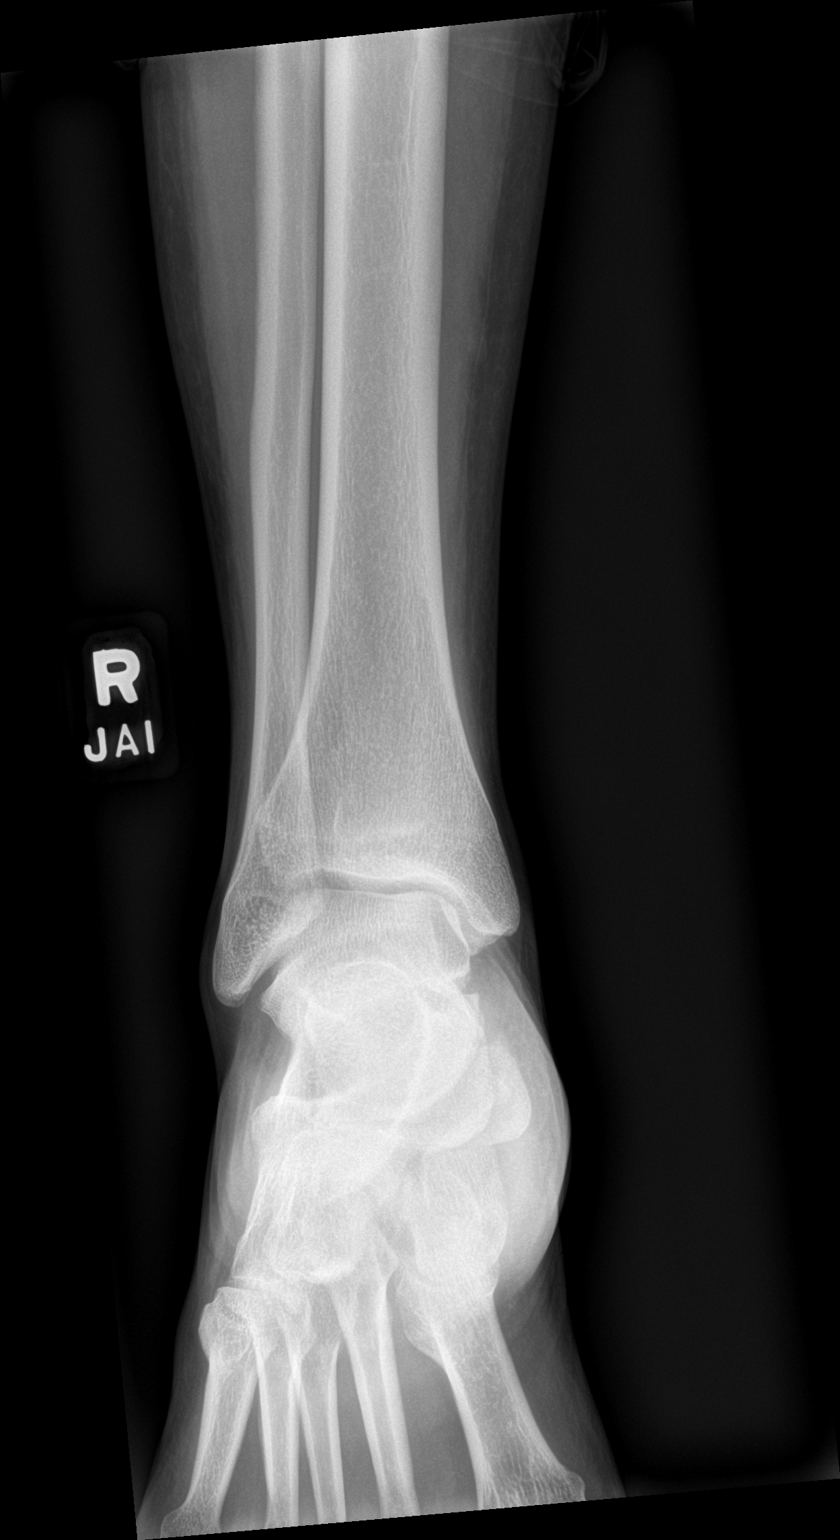

[ankle obl]
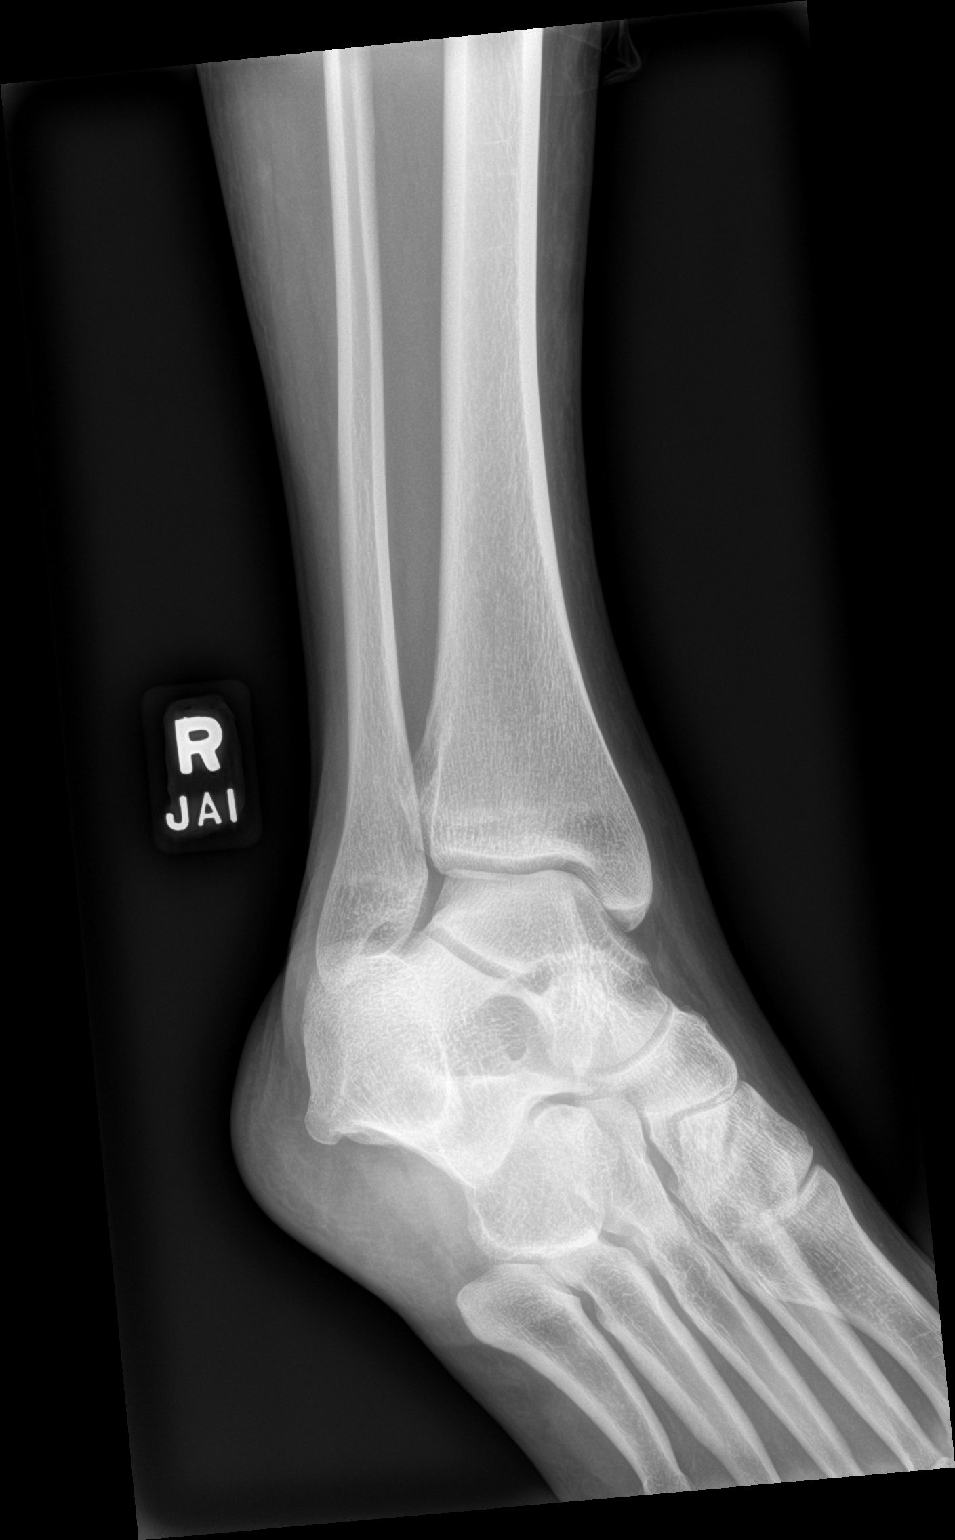

[ankle lat]
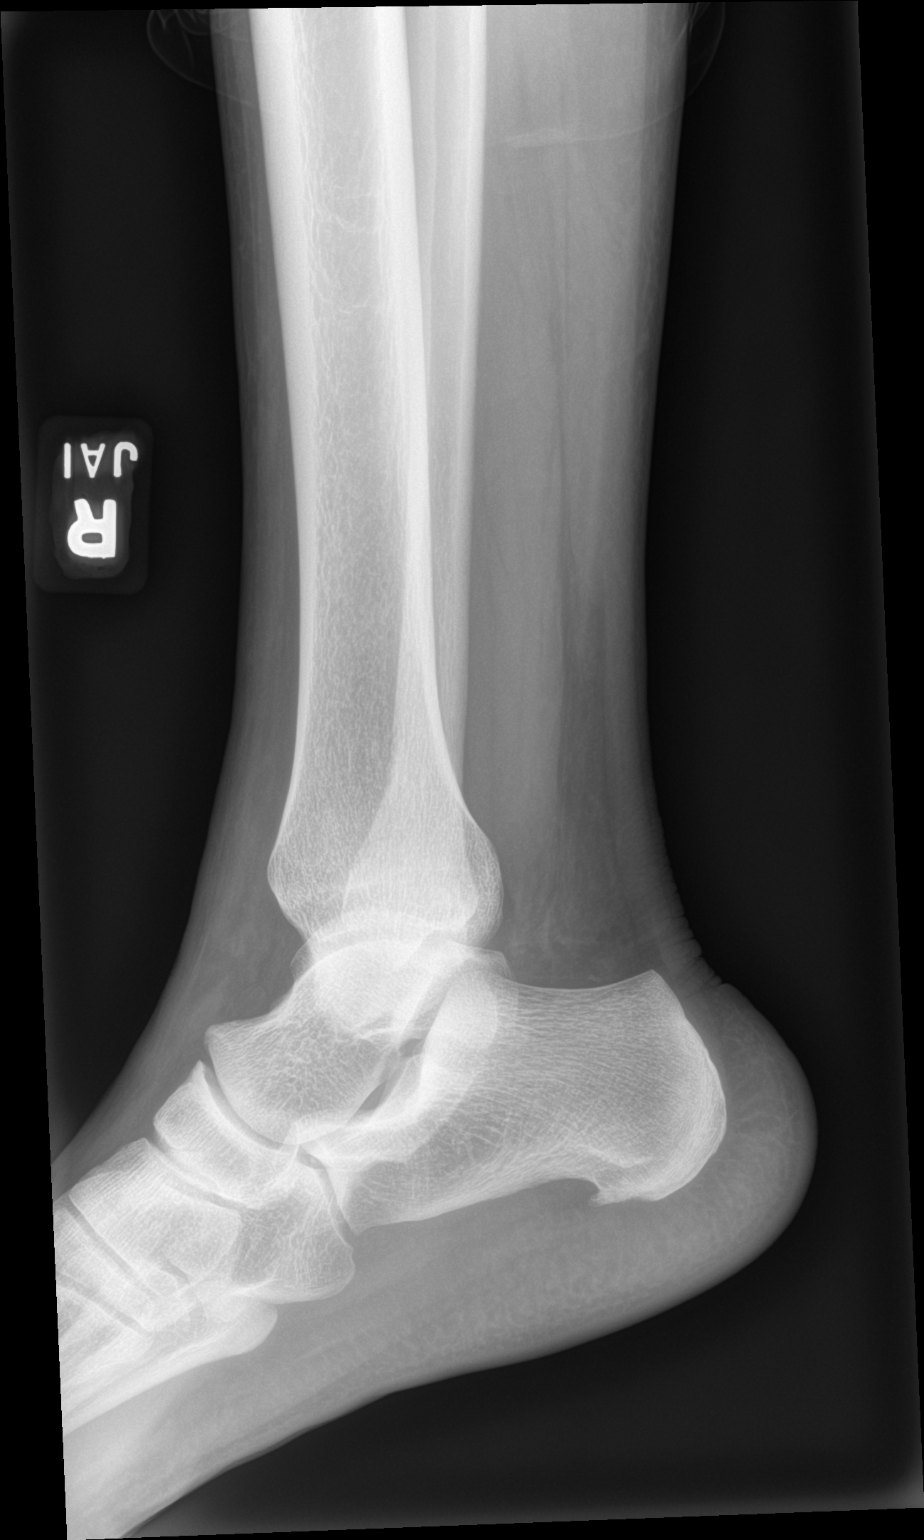

[3 of 3 positions shown; findings below may reference images not displayed]

FINDINGS: Small plantar calcaneal spur. No acute bony abnormality.
Specifically, no fracture, subluxation, or dislocation. Soft tissues
unremarkable. Joint spaces maintained.
IMPRESSION: Small calcaneal spur.  No acute bony abnormality.

## 2021-11-18 ENCOUNTER — Ambulatory Visit
Admission: RE | Admit: 2021-11-18 | Discharge: 2021-11-18 | Disposition: A | Discharge status: Home / Self Care | Payer: BC Managed Care – PPO | Source: Ambulatory Visit | Attending: Family Medicine | Admitting: Family Medicine

## 2021-11-18 ENCOUNTER — Other Ambulatory Visit: Payer: Self-pay | Admitting: Family Medicine

## 2021-11-18 DIAGNOSIS — R0789 Other chest pain: Secondary | ICD-10-CM

## 2021-12-02 ENCOUNTER — Ambulatory Visit: Payer: BC Managed Care – PPO | Admitting: Pulmonary Disease

## 2021-12-28 ENCOUNTER — Ambulatory Visit: Payer: BC Managed Care – PPO | Admitting: Pulmonary Disease

## 2022-04-19 ENCOUNTER — Other Ambulatory Visit: Payer: Self-pay | Admitting: Obstetrics & Gynecology

## 2022-04-19 DIAGNOSIS — Z1231 Encounter for screening mammogram for malignant neoplasm of breast: Secondary | ICD-10-CM

## 2022-05-31 NOTE — Progress Notes (Unsigned)
Daisy Sawyer [place] [address] [place] Daisy Sawyer Phone: [phone] Subjective:   Daisy Sawyer, am serving as a scribe for Dr. Hulan Saas.  I'm seeing this patient by the request  of:  Harlan Stains, MD  CC: Right shoulder pain  [id number]  Daisy Sawyer is a 57 y.o. female coming in with complaint of R shoulder pain. Last seen in 2022 for heel pain. Patient states in pain on right side. Thinks it's from constant inflammation. Muscle relaxer's only help a little. Tries not to take pain medication unless it's necessary. Pain running from traps to deltoid. Sometimes radiating pain down arm and numbness in finger tips.      Past Medical History:  Diagnosis Date   Asthma    Endometriosis    Environmental allergies    Hypercholesteremia    Past Surgical History:  Procedure Laterality Date   KELOID EXCISION     TONSILLECTOMY     Social History   Socioeconomic History   Marital status: Single    Spouse name: Not on file   Number of children: Not on file   Years of education: Not on file   Highest education level: Not on file  Occupational History   Occupation: Electrical engineer  Tobacco Use   Smoking status: Never   Smokeless tobacco: Never  Substance and Sexual Activity   Alcohol use: No   Drug use: No   Sexual activity: Not Currently    Birth control/protection: None  Other Topics Concern   Not on file  Social History Narrative   Not on file   Social Determinants of Health   Financial Resource Strain: Not on file  Food Insecurity: Not on file  Transportation Needs: Not on file  Physical Activity: Not on file  Stress: Not on file  Social Connections: Not on file   Allergies  Allergen Reactions   Anaprox [Naproxen] Itching and Swelling   Asa [Aspirin] Swelling   Ibuprofen Itching and Swelling   Other Itching and Swelling    Darvocet-N    Sulindac Itching and Swelling   Family History  Problem Relation Age of  Onset   Hypertension Father    Skin cancer Father    Hypertension Mother    Hypertension Sister    Diabetes Sister    Congestive Heart Failure Sister    CAD Sister    Breast cancer Maternal Aunt    Breast cancer Cousin        in 63's    Current Outpatient Medications (Endocrine & Metabolic):    predniSONE (DELTASONE) 20 MG tablet, Take 2 tablets (40 mg total) by mouth daily with breakfast.   norethindrone (AYGESTIN) 5 MG tablet, Take 1 tablet (5 mg total) by mouth 3 (three) times daily.  Current Outpatient Medications (Cardiovascular):    amLODipine (NORVASC) 5 MG tablet, Take 5 mg by mouth daily.    ezetimibe (ZETIA) 10 MG tablet, Take 10 mg by mouth daily.   hydrALAZINE (APRESOLINE) 10 MG tablet, Take 1 tablet (10 mg total) by mouth 4 (four) times daily.   triamterene-hydrochlorothiazide (MAXZIDE-25) 37.5-25 MG tablet, Take 1 tablet by mouth daily.   Current Outpatient Medications (Respiratory):    albuterol (VENTOLIN HFA) 108 (90 Base) MCG/ACT inhaler, Inhale 1-2 puffs into the lungs every 6 (six) hours as needed for wheezing or shortness of breath.    cetirizine (ZYRTEC) 10 MG tablet, Take 10 mg by mouth daily.   chlorpheniramine (CHLOR-TRIMETON) 4 MG tablet, Take  1 tablet (4 mg total) by mouth in the morning, at noon, and at bedtime.   triamcinolone (NASACORT) 55 MCG/ACT AERO nasal inhaler, Place 2 sprays into the nose daily.    Current Outpatient Medications (Other):    cyclobenzaprine (FLEXERIL) 10 MG tablet, cyclobenzaprine 10 mg tablet   Diclofenac Sodium 2 % SOLN, Place 2 g onto the skin 2 (two) times daily.   esomeprazole (NEXIUM) 40 MG capsule, Take 1 capsule (40 mg total) by mouth daily at 12 noon.   MULTIPLE VITAMIN PO, Take by mouth.   omeprazole (PRILOSEC) 40 MG capsule, Take 40 mg by mouth 2 (two) times daily. (Patient not taking: Reported on 11/28/2020)   PRESCRIPTION MEDICATION,    Vitamin D, Ergocalciferol, (DRISDOL) 1.25 MG (50000 UT) CAPS capsule, Take 1  capsule (50,000 Units total) by mouth every 7 (seven) days.   Reviewed prior external information including notes and imaging from  primary care provider As well as notes that were available from care everywhere and other healthcare systems.  Past medical history, social, surgical and family history all reviewed in electronic medical record.  No pertanent information unless stated regarding to the chief complaint.   Review of Systems:  No headache, visual changes, nausea, vomiting, diarrhea, constipation, dizziness, abdominal pain, skin rash, fevers, chills, night sweats, weight loss, swollen lymph nodes, body aches, joint swelling, chest pain, shortness of breath, mood changes. POSITIVE muscle aches  Objective  Blood pressure (!) 146/92, pulse 89, height '5\' 9"'$  (1.753 m), weight 183 lb (83 kg), SpO2 95 %.   General: No apparent distress alert and oriented x3 mood and affect normal, dressed appropriately.  HEENT: Pupils equal, extraocular movements intact  Respiratory: Patient's speak in full sentences and does not appear short of breath  Cardiovascular: No lower extremity edema, non tender, no erythema  Right shoulder exam shows the patient does have positive impingement with Neer and Hawkins.  No masses palpated at the moment.  Patient does have positive impingement noted.  Limited muscular skeletal ultrasound was performed and interpreted by Hulan Saas, M  Limited ultrasound shows the patient does have hypoechoic changes of the subscapularis area.  No significant tearing noted.  Patient does have some thickening of the anterior capsule noted.  Mild hypoechoic changes of the acromioclavicular joint also noted. Impression: Subacromial and subscapularis bursitis as well as some mild effusion of the [facility] joint    Impression and Recommendations:      The above documentation has been reviewed and is accurate and complete Daisy Pulley, DO

## 2022-06-01 ENCOUNTER — Ambulatory Visit (INDEPENDENT_AMBULATORY_CARE_PROVIDER_SITE_OTHER): Payer: BC Managed Care – PPO

## 2022-06-01 ENCOUNTER — Ambulatory Visit: Payer: BC Managed Care – PPO | Admitting: Family Medicine

## 2022-06-01 ENCOUNTER — Ambulatory Visit: Payer: Self-pay

## 2022-06-01 VITALS — BP 146/92 | HR 89 | Ht 69.0 in | Wt 183.0 lb

## 2022-06-01 DIAGNOSIS — M542 Cervicalgia: Secondary | ICD-10-CM

## 2022-06-01 DIAGNOSIS — R0789 Other chest pain: Secondary | ICD-10-CM

## 2022-06-01 DIAGNOSIS — M25511 Pain in right shoulder: Secondary | ICD-10-CM

## 2022-06-01 DIAGNOSIS — M7551 Bursitis of right shoulder: Secondary | ICD-10-CM

## 2022-06-01 MED ORDER — PREDNISONE 20 MG PO TABS: 40.0000 mg | ORAL_TABLET | Freq: Every day | ORAL | 0 refills | Status: AC

## 2022-06-01 NOTE — Assessment & Plan Note (Addendum)
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  Patient declined injection.  Following for mammogram in 2 weeks.  Patient knows of worsening pain to seek medical attention.  X-rays of the shoulder and neck are pending.declined in yesjection today but may be warranted at follow up

## 2022-06-01 NOTE — Patient Instructions (Addendum)
Good to see you! Xray today Voltaren and ice 2x a day Keep hands in peripheral vision but can workout  Do prescribed exercises at least 3x a week  See you again in 5 weeks

## 2022-06-03 ENCOUNTER — Telehealth: Payer: Self-pay | Admitting: Family Medicine

## 2022-06-03 NOTE — Telephone Encounter (Signed)
She is signed up for MyChart and that is what we will use as our primary mode of communication  Mild arthritis of the neck otherwise everything else appears to be unremarkable

## 2022-06-03 NOTE — Telephone Encounter (Signed)
Patient called back. Gave her Dr Thompson Caul message. She expressed understanding.

## 2022-06-03 NOTE — Telephone Encounter (Signed)
Pt had multiple xray at last visit, would like results. Does NOT use MyChart.

## 2022-06-23 ENCOUNTER — Ambulatory Visit
Admission: RE | Admit: 2022-06-23 | Discharge: 2022-06-23 | Disposition: A | Discharge status: Home / Self Care | Payer: BC Managed Care – PPO | Source: Ambulatory Visit | Attending: Obstetrics & Gynecology | Admitting: Obstetrics & Gynecology

## 2022-06-23 DIAGNOSIS — Z1231 Encounter for screening mammogram for malignant neoplasm of breast: Secondary | ICD-10-CM

## 2022-07-06 NOTE — Progress Notes (Unsigned)
[facility] [place] Phone: [phone] Subjective:   Rito Ehrlich, am serving as a scribe for Dr. Hulan Saas.  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  Patient declined injection.  Following for mammogram in 2 weeks.  Patient knows of worsening pain to seek medical attention.  X-rays of the shoulder and neck are pending.declined in yesjection today but may be warranted at follow up    Update 07/07/2022 Daisy Sawyer is a 57 y.o. female coming in with complaint of R shoulder pain. Patient states she hurts every and feels very tense around her shoulders and neck. Patient states that her pain is running along her right arm and sometimes her fingers will go numb. Patient is very concerned about the injection and has some questions.   Xray cervical 06/01/2022 IMPRESSION: Mild degenerative change without acute abnormality.  Xray R shoulder 06/01/22 Negative       Past Medical History:  Diagnosis Date   Asthma    Endometriosis    Environmental allergies    Hypercholesteremia    Past Surgical History:  Procedure Laterality Date   KELOID EXCISION     TONSILLECTOMY     Social History   Socioeconomic History   Marital status: Single    Spouse name: Not on file   Number of children: Not on file   Years of education: Not on file   Highest education level: Not on file  Occupational History   Occupation: Electrical engineer  Tobacco Use   Smoking status: Never   Smokeless tobacco: Never  Substance and Sexual Activity   Alcohol use: No   Drug use: No   Sexual activity: Not Currently    Birth control/protection: None  Other Topics Concern   Not on file  Social History Narrative   Not on file   Social  Determinants of Health   Financial Resource Strain: Not on file  Food Insecurity: Not on file  Transportation Needs: Not on file  Physical Activity: Not on file  Stress: Not on file  Social Connections: Not on file   Allergies  Allergen Reactions   Anaprox [Naproxen] Itching and Swelling   Asa [Aspirin] Swelling   Ibuprofen Itching and Swelling   Other Itching and Swelling    Darvocet-N    Sulindac Itching and Swelling   Family History  Problem Relation Age of Onset   Hypertension Father    Skin cancer Father    Hypertension Mother    Hypertension Sister    Diabetes Sister    Congestive Heart Failure Sister    CAD Sister    Breast cancer Maternal Aunt    Breast cancer Cousin        in 29's    Current Outpatient Medications (Endocrine & Metabolic):    norethindrone (AYGESTIN) 5 MG tablet, Take 1 tablet (5 mg total) by mouth 3 (three) times daily.   predniSONE (DELTASONE) 20 MG tablet, Take 2 tablets (40 mg total) by mouth daily with breakfast.  Current Outpatient Medications (Cardiovascular):    amLODipine (NORVASC) 5 MG tablet, Take 5 mg by mouth daily.    ezetimibe (ZETIA) 10 MG tablet, Take 10 mg by mouth daily.   hydrALAZINE (APRESOLINE) 10 MG tablet, Take 1 tablet (10 mg  total) by mouth 4 (four) times daily.   triamterene-hydrochlorothiazide (MAXZIDE-25) 37.5-25 MG tablet, Take 1 tablet by mouth daily.   Current Outpatient Medications (Respiratory):    albuterol (VENTOLIN HFA) 108 (90 Base) MCG/ACT inhaler, Inhale 1-2 puffs into the lungs every 6 (six) hours as needed for wheezing or shortness of breath.    cetirizine (ZYRTEC) 10 MG tablet, Take 10 mg by mouth daily.   triamcinolone (NASACORT) 55 MCG/ACT AERO nasal inhaler, Place 2 sprays into the nose daily.   chlorpheniramine (CHLOR-TRIMETON) 4 MG tablet, Take 1 tablet (4 mg total) by mouth in the morning, at noon, and at bedtime.    Current Outpatient Medications (Other):    cyclobenzaprine (FLEXERIL) 10 MG  tablet, cyclobenzaprine 10 mg tablet   Diclofenac Sodium 2 % SOLN, Place 2 g onto the skin 2 (two) times daily.   esomeprazole (NEXIUM) 40 MG capsule, Take 1 capsule (40 mg total) by mouth daily at 12 noon.   MULTIPLE VITAMIN PO, Take by mouth.   omeprazole (PRILOSEC) 40 MG capsule, Take 40 mg by mouth 2 (two) times daily.   PRESCRIPTION MEDICATION,    Vitamin D, Ergocalciferol, (DRISDOL) 1.25 MG (50000 UT) CAPS capsule, Take 1 capsule (50,000 Units total) by mouth every 7 (seven) days.   Reviewed prior external information including notes and imaging from  primary care provider As well as notes that were available from care everywhere and other healthcare systems.  Past medical history, social, surgical and family history all reviewed in electronic medical record.  No pertanent information unless stated regarding to the chief complaint.   Review of Systems:  No headache, visual changes, nausea, vomiting, diarrhea, constipation, dizziness, abdominal pain, skin rash, fevers, chills, night sweats, weight loss, swollen lymph nodes, body aches, joint swelling, chest pain, shortness of breath, mood changes. POSITIVE muscle aches  Objective  Blood pressure 120/80, pulse 68, height '5\' 9"'$  (1.753 m), weight 187 lb (84.8 kg), SpO2 98 %.   General: No apparent distress alert and oriented x3 mood and affect normal, dressed appropriately.  HEENT: Pupils equal, extraocular movements intact  Respiratory: Patient's speak in full sentences and does not appear short of breath  Cardiovascular: No lower extremity edema, non tender, no erythema  Right shoulder exam does have positive impingement noted.  Patient does have some limited range of motion secondary to voluntary guarding.  Procedure: Real-time Ultrasound Guided Injection of right glenohumeral joint Device: GE Logiq Q7  Ultrasound guided injection is preferred based studies that show increased duration, increased effect, greater accuracy, decreased  procedural pain, increased response rate with ultrasound guided versus blind injection.  Verbal informed consent obtained.  Time-out conducted.  Noted no overlying erythema, induration, or other signs of local infection.  Skin prepped in a sterile fashion.  Local anesthesia: Topical Ethyl chloride.  With sterile technique and under real time ultrasound guidance:  Joint visualized.  23g 1  inch needle inserted posterior approach. Pictures taken for needle placement. Patient did have injection of , 2 cc of 0.5% Marcaine, and 1.0 cc of Kenalog 40 mg/dL. Completed without difficulty  Pain immediately improved suggesting accurate placement of the medication.  Advised to call if fevers/chills, erythema, induration, drainage, or persistent bleeding.  Impression: Technically successful ultrasound guided injection.    Impression and Recommendations:    The above documentation has been reviewed and is accurate and complete Lyndal Pulley, DO

## 2022-07-07 ENCOUNTER — Encounter: Payer: Self-pay | Admitting: Family Medicine

## 2022-07-07 ENCOUNTER — Ambulatory Visit: Payer: Self-pay

## 2022-07-07 ENCOUNTER — Ambulatory Visit: Payer: BC Managed Care – PPO | Admitting: Family Medicine

## 2022-07-07 VITALS — BP 120/80 | HR 68 | Ht 69.0 in | Wt 187.0 lb

## 2022-07-07 DIAGNOSIS — M25511 Pain in right shoulder: Secondary | ICD-10-CM

## 2022-07-07 DIAGNOSIS — G8929 Other chronic pain: Secondary | ICD-10-CM | POA: Diagnosis not present

## 2022-07-07 DIAGNOSIS — M7551 Bursitis of right shoulder: Secondary | ICD-10-CM

## 2022-07-07 NOTE — Patient Instructions (Signed)
Good to see you  Injection today Give it a break till next week See me again in 2 months

## 2022-07-07 NOTE — Assessment & Plan Note (Signed)
Chronic problem with worsening symptoms, has failed a home exercise program.  Given different exercises and icing regimen.  Discussed which activities to do and which ones to avoid.  Increase activity slowly.  Follow-up again in 6 to 8 weeks

## 2022-08-11 ENCOUNTER — Other Ambulatory Visit: Payer: Self-pay | Admitting: Family Medicine

## 2022-08-11 DIAGNOSIS — R102 Pelvic and perineal pain: Secondary | ICD-10-CM

## 2022-09-03 ENCOUNTER — Other Ambulatory Visit: Payer: BC Managed Care – PPO

## 2022-09-03 ENCOUNTER — Ambulatory Visit
Admission: RE | Admit: 2022-09-03 | Discharge: 2022-09-03 | Disposition: A | Discharge status: Home / Self Care | Payer: BC Managed Care – PPO | Source: Ambulatory Visit | Attending: Family Medicine | Admitting: Family Medicine

## 2022-09-03 DIAGNOSIS — R102 Pelvic and perineal pain: Secondary | ICD-10-CM

## 2022-09-07 NOTE — Progress Notes (Deleted)
[facility] [address] [place] [address] Phone: [phone] Subjective:    I'm seeing this patient by the request  of:  Daisy Montana, MD  CC:   [id number]  07/07/2022 Chronic problem with worsening symptoms, has failed a home exercise program. Given different exercises and icing regimen. Discussed which activities to do and which ones to avoid. Increase activity slowly. Follow-up again in 6 to 8 weeks   Update 09/08/2022 Daisy Sawyer is a 57 y.o. female coming in with complaint of R shoulder pain. Patient states      Past Medical History:  Diagnosis Date   Asthma    Endometriosis    Environmental allergies    Hypercholesteremia    Past Surgical History:  Procedure Laterality Date   KELOID EXCISION     TONSILLECTOMY     Social History   Socioeconomic History   Marital status: Single    Spouse name: Not on file   Number of children: Not on file   Years of education: Not on file   Highest education level: Not on file  Occupational History   Occupation: Air traffic controller  Tobacco Use   Smoking status: Never   Smokeless tobacco: Never  Substance and Sexual Activity   Alcohol use: No   Drug use: No   Sexual activity: Not Currently    Birth control/protection: None  Other Topics Concern   Not on file  Social History Narrative   Not on file   Social Determinants of Health   Financial Resource Strain: Not on file  Food Insecurity: Not on file  Transportation Needs: Not on file  Physical Activity: Not on file  Stress: Not on file  Social Connections: Not on file   Allergies  Allergen Reactions   Anaprox [Naproxen] Itching and Swelling   Asa [Aspirin] Swelling   Ibuprofen Itching and Swelling   Other Itching and Swelling    Darvocet-N    Sulindac Itching and Swelling   Family History  Problem Relation Age of Onset   Hypertension Father    Skin cancer Father    Hypertension Mother    Hypertension Sister     Diabetes Sister    Congestive Heart Failure Sister    CAD Sister    Breast cancer Maternal Aunt    Breast cancer Cousin        in 32's    Current Outpatient Medications (Endocrine & Metabolic):    norethindrone (AYGESTIN) 5 MG tablet, Take 1 tablet (5 mg total) by mouth 3 (three) times daily.   predniSONE (DELTASONE) 20 MG tablet, Take 2 tablets (40 mg total) by mouth daily with breakfast.  Current Outpatient Medications (Cardiovascular):    amLODipine (NORVASC) 5 MG tablet, Take 5 mg by mouth daily.    ezetimibe (ZETIA) 10 MG tablet, Take 10 mg by mouth daily.   hydrALAZINE (APRESOLINE) 10 MG tablet, Take 1 tablet (10 mg total) by mouth 4 (four) times daily.   triamterene-hydrochlorothiazide (MAXZIDE-25) 37.5-25 MG tablet, Take 1 tablet by mouth daily.   Current Outpatient Medications (Respiratory):    albuterol (VENTOLIN HFA) 108 (90 Base) MCG/ACT inhaler, Inhale 1-2 puffs into the lungs every 6 (six) hours as needed for wheezing or shortness of breath.    cetirizine (ZYRTEC) 10 MG tablet, Take 10 mg by mouth daily.   chlorpheniramine (CHLOR-TRIMETON) 4 MG tablet, Take 1 tablet (4 mg total) by mouth in the morning, at noon, and at bedtime.   triamcinolone (NASACORT) 55  MCG/ACT AERO nasal inhaler, Place 2 sprays into the nose daily.    Current Outpatient Medications (Other):    cyclobenzaprine (FLEXERIL) 10 MG tablet, cyclobenzaprine 10 mg tablet   Diclofenac Sodium 2 % SOLN, Place 2 g onto the skin 2 (two) times daily.   esomeprazole (NEXIUM) 40 MG capsule, Take 1 capsule (40 mg total) by mouth daily at 12 noon.   MULTIPLE VITAMIN PO, Take by mouth.   omeprazole (PRILOSEC) 40 MG capsule, Take 40 mg by mouth 2 (two) times daily.   PRESCRIPTION MEDICATION,    Vitamin D, Ergocalciferol, (DRISDOL) 1.25 MG (50000 UT) CAPS capsule, Take 1 capsule (50,000 Units total) by mouth every 7 (seven) days.   Reviewed prior external information including notes and imaging from  primary care  provider As well as notes that were available from care everywhere and other healthcare systems.  Past medical history, social, surgical and family history all reviewed in electronic medical record.  No pertanent information unless stated regarding to the chief complaint.   Review of Systems:  No headache, visual changes, nausea, vomiting, diarrhea, constipation, dizziness, abdominal pain, skin rash, fevers, chills, night sweats, weight loss, swollen lymph nodes, body aches, joint swelling, chest pain, shortness of breath, mood changes. POSITIVE muscle aches  Objective  There were no vitals taken for this visit.   General: No apparent distress alert and oriented x3 mood and affect normal, dressed appropriately.  HEENT: Pupils equal, extraocular movements intact  Respiratory: Patient's speak in full sentences and does not appear short of breath  Cardiovascular: No lower extremity edema, non tender, no erythema      Impression and Recommendations:

## 2022-09-08 ENCOUNTER — Ambulatory Visit: Payer: BC Managed Care – PPO | Admitting: Family Medicine

## 2022-10-26 NOTE — Progress Notes (Unsigned)
[name] [address] [place] [address] Phone: [phone] Subjective:   Bruce Donath, am serving as a scribe for Dr. Antoine Primas.  I'm seeing this patient by the request  of:  Laurann Montana, MD  CC: Right shoulder pain follow-up  [id number]  07/07/2022 Chronic problem with worsening symptoms, has failed a home exercise program.  Given different exercises and icing regimen.  Discussed which activities to do and which ones to avoid.  Increase activity slowly.  Follow-up again in 6 to 8 weeks      Update 10/27/2022 ILAH BOULE is a 57 y.o. female coming in with complaint of R shoulder pain. Injection last visit. Patient states that she had relief from injection for about 2 months. Pain on top of shoulder that radiates down her arm. Took prednisone recently and this helped shoulder pain but as soon as she finishes the course her pain returns.       Past Medical History:  Diagnosis Date   Asthma    Endometriosis    Environmental allergies    Hypercholesteremia    Past Surgical History:  Procedure Laterality Date   KELOID EXCISION     TONSILLECTOMY     Social History   Socioeconomic History   Marital status: Single    Spouse name: Not on file   Number of children: Not on file   Years of education: Not on file   Highest education level: Not on file  Occupational History   Occupation: Air traffic controller  Tobacco Use   Smoking status: Never   Smokeless tobacco: Never  Substance and Sexual Activity   Alcohol use: No   Drug use: No   Sexual activity: Not Currently    Birth control/protection: None  Other Topics Concern   Not on file  Social History Narrative   Not on file   Social Determinants of Health   Financial Resource Strain: Not on file  Food Insecurity: Not on file  Transportation Needs: Not on file  Physical Activity: Not on file  Stress: Not on file  Social Connections: Not on file   Allergies   Allergen Reactions   Anaprox [Naproxen] Itching and Swelling   Asa [Aspirin] Swelling   Ibuprofen Itching and Swelling   Other Itching and Swelling    Darvocet-N    Sulindac Itching and Swelling   Family History  Problem Relation Age of Onset   Hypertension Father    Skin cancer Father    Hypertension Mother    Hypertension Sister    Diabetes Sister    Congestive Heart Failure Sister    CAD Sister    Breast cancer Maternal Aunt    Breast cancer Cousin        in 24's    Current Outpatient Medications (Endocrine & Metabolic):    norethindrone (AYGESTIN) 5 MG tablet, Take 1 tablet (5 mg total) by mouth 3 (three) times daily.   predniSONE (DELTASONE) 20 MG tablet, Take 2 tablets (40 mg total) by mouth daily with breakfast.  Current Outpatient Medications (Cardiovascular):    amLODipine (NORVASC) 5 MG tablet, Take 5 mg by mouth daily.    ezetimibe (ZETIA) 10 MG tablet, Take 10 mg by mouth daily.   hydrALAZINE (APRESOLINE) 10 MG tablet, Take 1 tablet (10 mg total) by mouth 4 (four) times daily.   triamterene-hydrochlorothiazide (MAXZIDE-25) 37.5-25 MG tablet, Take 1 tablet by mouth daily.   Current Outpatient Medications (Respiratory):    albuterol (VENTOLIN HFA)  108 (90 Base) MCG/ACT inhaler, Inhale 1-2 puffs into the lungs every 6 (six) hours as needed for wheezing or shortness of breath.    cetirizine (ZYRTEC) 10 MG tablet, Take 10 mg by mouth daily.   triamcinolone (NASACORT) 55 MCG/ACT AERO nasal inhaler, Place 2 sprays into the nose daily.   chlorpheniramine (CHLOR-TRIMETON) 4 MG tablet, Take 1 tablet (4 mg total) by mouth in the morning, at noon, and at bedtime.    Current Outpatient Medications (Other):    cyclobenzaprine (FLEXERIL) 10 MG tablet, cyclobenzaprine 10 mg tablet   Diclofenac Sodium 2 % SOLN, Place 2 g onto the skin 2 (two) times daily.   esomeprazole (NEXIUM) 40 MG capsule, Take 1 capsule (40 mg total) by mouth daily at 12 noon.   MULTIPLE VITAMIN PO, Take  by mouth.   omeprazole (PRILOSEC) 40 MG capsule, Take 40 mg by mouth 2 (two) times daily.   PRESCRIPTION MEDICATION,    Vitamin D, Ergocalciferol, (DRISDOL) 1.25 MG (50000 UT) CAPS capsule, Take 1 capsule (50,000 Units total) by mouth every 7 (seven) days.   Reviewed prior external information including notes and imaging from  primary care provider As well as notes that were available from care everywhere and other healthcare systems.  Past medical history, social, surgical and family history all reviewed in electronic medical record.  No pertanent information unless stated regarding to the chief complaint.   Review of Systems:  No headache, visual changes, nausea, vomiting, diarrhea, constipation, dizziness, abdominal pain, skin rash, fevers, chills, night sweats, weight loss, swollen lymph nodes, body aches, joint swelling, chest pain, shortness of breath, mood changes. POSITIVE muscle aches  Objective  Blood pressure 122/82, pulse 81, height 5\' 9"  (1.753 m), weight 187 lb (84.8 kg), SpO2 97 %.   General: No apparent distress alert and oriented x3 mood and affect normal, dressed appropriately.  HEENT: Pupils equal, extraocular movements intact  Respiratory: Patient's speak in full sentences and does not appear short of breath  Cardiovascular: No lower extremity edema, non tender, no erythema  Shoulder: Right Inspection reveals no abnormalities, atrophy or asymmetry. Palpation is normal with no tenderness over AC joint or bicipital groove. ROM is full in all planes passively. Rotator cuff strength normal throughout. signs of impingement with positive Neer and Hawkin's tests, but negative empty can sign. Speeds and Yergason's tests normal. No labral pathology noted with negative Obrien's, negative clunk and good stability. Normal scapular function observed. No painful arc and no drop arm sign. No apprehension sign  MSK US performed of: Right This study was ordered, performed, and  interpreted by Terrilee Files D.O.  Shoulder:   Supraspinatus: Does have a very small partial tear noted that seems to be low-grade to the supraspinatus. AC joint:  Capsule undistended, no geyser sign. Glenohumeral Joint:  Appears normal without effusion. Glenoid Labrum: Calcific changes noted Biceps Tendon:  Appears normal on long and transverse views, no fraying of tendon, tendon located in intertubercular groove, no subluxation with shoulder internal or external rotation.  Impression: Continued mild bursitis with possible rotator cuff and labral pathology  Procedure: Real-time Ultrasound Guided Injection of right glenohumeral joint Device: GE Logiq E  Ultrasound guided injection is preferred based studies that show increased duration, increased effect, greater accuracy, decreased procedural pain, increased response rate with ultrasound guided versus blind injection.  Verbal informed consent obtained.  Time-out conducted.  Noted no overlying erythema, induration, or other signs of local infection.  Skin prepped in a sterile fashion.  Local anesthesia:  Topical Ethyl chloride.  With sterile technique and under real time ultrasound guidance:  Joint visualized.  23g 1  inch needle inserted posterior approach. Pictures taken for needle placement. Patient did have injection of  2 cc of 0.5% Marcaine, and 1.0 cc of Kenalog 40 mg/dL. Completed without difficulty  Pain immediately resolved suggesting accurate placement of the medication.  Advised to call if fevers/chills, erythema, induration, drainage, or persistent bleeding.  Impression: Technically successful ultrasound guided injection.    Impression and Recommendations:     The above documentation has been reviewed and is accurate and complete Judi Saa, DO

## 2022-10-27 ENCOUNTER — Other Ambulatory Visit: Payer: Self-pay

## 2022-10-27 ENCOUNTER — Ambulatory Visit: Payer: BC Managed Care – PPO | Admitting: Family Medicine

## 2022-10-27 ENCOUNTER — Encounter: Payer: Self-pay | Admitting: Family Medicine

## 2022-10-27 VITALS — BP 122/82 | HR 81 | Ht 69.0 in | Wt 187.0 lb

## 2022-10-27 DIAGNOSIS — M25511 Pain in right shoulder: Secondary | ICD-10-CM | POA: Diagnosis not present

## 2022-10-27 DIAGNOSIS — M7551 Bursitis of right shoulder: Secondary | ICD-10-CM

## 2022-10-27 NOTE — Patient Instructions (Signed)
Injected shoulder today See me again in 3 months 

## 2022-10-27 NOTE — Assessment & Plan Note (Signed)
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  In addition of this does seem to have some calcific changes noted of the posterior labrum.  Discussed with patient that if worsening symptoms advanced imaging may be warranted.  We discussed some home exercises and icing regimen.  Patient is also having signs and symptoms consistent with potentially some frozen shoulder that we will need to monitor as well.  Declined formal physical therapy.  Follow-up again in 6 to 8 weeks.

## 2023-01-13 ENCOUNTER — Encounter: Payer: Self-pay | Admitting: Family Medicine

## 2023-01-26 ENCOUNTER — Ambulatory Visit: Payer: BC Managed Care – PPO | Admitting: Family Medicine

## 2023-02-24 NOTE — Progress Notes (Signed)
[name] [address] [place] [address] Phone: [phone] Subjective:   INadine Counts, am serving as a scribe for Dr. Antoine Primas.  I'm seeing this patient by the request  of:  Laurann Montana, MD  CC: Right shoulder pain follow-up  [id number]  Elizzie Flaherty Lasek is a 57 y.o. female coming in with complaint of R shoulder pain. Patient states at night and early morning is the worse. Weather has an effect. Uses ice and heat depending to help with pain management. Pain dissipates during the day and then comes back at night. Pain can wake her at night.     Past Medical History:  Diagnosis Date   Asthma    Endometriosis    Environmental allergies    Hypercholesteremia    Past Surgical History:  Procedure Laterality Date   KELOID EXCISION     TONSILLECTOMY     Social History   Socioeconomic History   Marital status: Single    Spouse name: Not on file   Number of children: Not on file   Years of education: Not on file   Highest education level: Not on file  Occupational History   Occupation: Air traffic controller  Tobacco Use   Smoking status: Never   Smokeless tobacco: Never  Substance and Sexual Activity   Alcohol use: No   Drug use: No   Sexual activity: Not Currently    Birth control/protection: None  Other Topics Concern   Not on file  Social History Narrative   ** Merged History Encounter **       Social Determinants of Health   Financial Resource Strain: Not on file  Food Insecurity: Not on file  Transportation Needs: Not on file  Physical Activity: Not on file  Stress: Not on file  Social Connections: Not on file   Allergies  Allergen Reactions   Anaprox [Naproxen] Itching and Swelling   Asa [Aspirin] Swelling   Ibuprofen Itching and Swelling   Other Itching and Swelling    Darvocet-N    Sulindac Itching and Swelling   Family History  Problem Relation Age of Onset   Hypertension Father    Skin cancer  Father    Hypertension Mother    Hypertension Sister    Diabetes Sister    Congestive Heart Failure Sister    CAD Sister    Breast cancer Maternal Aunt    Breast cancer Cousin        in 61's    Current Outpatient Medications (Endocrine & Metabolic):    norethindrone (AYGESTIN) 5 MG tablet, Take 1 tablet (5 mg total) by mouth 3 (three) times daily.   predniSONE (DELTASONE) 20 MG tablet, Take 2 tablets (40 mg total) by mouth daily with breakfast.  Current Outpatient Medications (Cardiovascular):    amLODipine (NORVASC) 5 MG tablet, Take 5 mg by mouth daily.    ezetimibe (ZETIA) 10 MG tablet, Take 10 mg by mouth daily.   hydrALAZINE (APRESOLINE) 10 MG tablet, Take 1 tablet (10 mg total) by mouth 4 (four) times daily.   triamterene-hydrochlorothiazide (MAXZIDE-25) 37.5-25 MG tablet, Take 1 tablet by mouth daily.   Current Outpatient Medications (Respiratory):    albuterol (VENTOLIN HFA) 108 (90 Base) MCG/ACT inhaler, Inhale 1-2 puffs into the lungs every 6 (six) hours as needed for wheezing or shortness of breath.    cetirizine (ZYRTEC) 10 MG tablet, Take 10 mg by mouth daily.   chlorpheniramine (CHLOR-TRIMETON) 4 MG tablet, Take 1 tablet (4  mg total) by mouth in the morning, at noon, and at bedtime.   triamcinolone (NASACORT) 55 MCG/ACT AERO nasal inhaler, Place 2 sprays into the nose daily.    Current Outpatient Medications (Other):    cyclobenzaprine (FLEXERIL) 10 MG tablet, cyclobenzaprine 10 mg tablet   Diclofenac Sodium 2 % SOLN, Place 2 g onto the skin 2 (two) times daily.   esomeprazole (NEXIUM) 40 MG capsule, Take 1 capsule (40 mg total) by mouth daily at 12 noon.   MULTIPLE VITAMIN PO, Take by mouth.   omeprazole (PRILOSEC) 40 MG capsule, Take 40 mg by mouth 2 (two) times daily.   PRESCRIPTION MEDICATION,    Vitamin D, Ergocalciferol, (DRISDOL) 1.25 MG (50000 UT) CAPS capsule, Take 1 capsule (50,000 Units total) by mouth every 7 (seven) days.   Reviewed prior external  information including notes and imaging from  primary care provider As well as notes that were available from care everywhere and other healthcare systems.  Past medical history, social, surgical and family history all reviewed in electronic medical record.  No pertanent information unless stated regarding to the chief complaint.   Review of Systems:  No headache, visual changes, nausea, vomiting, diarrhea, constipation, dizziness, abdominal pain, skin rash, fevers, chills, night sweats, weight loss, swollen lymph nodes, body aches, joint swelling, chest pain, shortness of breath, mood changes. POSITIVE muscle aches  Objective  Blood pressure 120/84, pulse 81, height 5\' 9"  (1.753 m), weight 184 lb (83.5 kg), SpO2 95%.   General: No apparent distress alert and oriented x3 mood and affect normal, dressed appropriately.  HEENT: Pupils equal, extraocular movements intact  Respiratory: Patient's speak in full sentences and does not appear short of breath  Cardiovascular: No lower extremity edema, non tender, no erythema  Right shoulder exam shows the patient does have tenderness to palpation of the acromioclavicular joint, rotator cuff strength does appear to be intact.  Positive crossover noted.   Procedure: Real-time Ultrasound Guided Injection of right acromioclavicular joint Device: GE Logiq Q7 Ultrasound guided injection is preferred based studies that show increased duration, increased effect, greater accuracy, decreased procedural pain, increased response rate, and decreased cost with ultrasound guided versus blind injection.  Verbal informed consent obtained.  Time-out conducted.  Noted no overlying erythema, induration, or other signs of local infection.  Skin prepped in a sterile fashion.  Local anesthesia: Topical Ethyl chloride.  With sterile technique and under real time ultrasound guidance: With a 25-gauge half inch needle injected with 0.5 cc of 0.5% Marcaine and 0.5 cc of  Kenalog 40 mg/mL Completed without difficulty  Pain immediately resolved suggesting accurate placement of the medication.  Advised to call if fevers/chills, erythema, induration, drainage, or persistent bleeding.  Impression: Technically successful ultrasound guided injection.   Impression and Recommendations:    The above documentation has been reviewed and is accurate and complete Judi Saa, DO

## 2023-03-02 ENCOUNTER — Other Ambulatory Visit: Payer: Self-pay

## 2023-03-02 ENCOUNTER — Ambulatory Visit: Payer: BC Managed Care – PPO | Admitting: Family Medicine

## 2023-03-02 ENCOUNTER — Encounter: Payer: Self-pay | Admitting: Family Medicine

## 2023-03-02 VITALS — BP 120/84 | HR 81 | Ht 69.0 in | Wt 184.0 lb

## 2023-03-02 DIAGNOSIS — M25511 Pain in right shoulder: Secondary | ICD-10-CM | POA: Diagnosis not present

## 2023-03-02 DIAGNOSIS — M25411 Effusion, right shoulder: Secondary | ICD-10-CM | POA: Insufficient documentation

## 2023-03-02 NOTE — Assessment & Plan Note (Signed)
No new problem, significant worsening affecting daily activities.  Given injection today with improvement in range of motion.  Does have some mild subacromial bursitis that could also still be contributing.  We discussed advanced imaging which patient declined.  Discussed icing regimen and home exercises, increase activity slowly otherwise.  Follow-up again in 6 to 8 weeks

## 2023-03-02 NOTE — Patient Instructions (Signed)
Injection in shoulder today Try to keep hands in peripheral vision Voltaren and ice Heat early in day See you again in 2-3 months

## 2023-06-01 NOTE — Progress Notes (Unsigned)
[name] [address] [place] [address] Phone: [phone] Subjective:   Daisy Sawyer, am serving as a scribe for Dr. Antoine Primas.  I'm seeing this patient by the request  of:  Laurann Montana, MD  CC: Right shoulder pain left wrist mass  [id number]  10/302024 No new problem, significant worsening affecting daily activities.  Given injection today with improvement in range of motion.  Does have some mild subacromial bursitis that could also still be contributing.  We discussed advanced imaging which patient declined.  Discussed icing regimen and home exercises, increase activity slowly otherwise.  Follow-up again in 6 to 8 weeks      Update 06/02/2023 Daisy Sawyer is a 58 y.o. female coming in with complaint of R shoulder pain. Patient states that she will get stiff with cold weather. Painful to lift heavy objects.   Ganglion cyst in L wrist over dorsal aspect. Getting more painful.   Notes pain throughout all joints.  Patient feels like she is just having pain all over the place at the moment.  Does not know exactly what is potentially going on.       Past Medical History:  Diagnosis Date   Asthma    Endometriosis    Environmental allergies    Hypercholesteremia    Past Surgical History:  Procedure Laterality Date   KELOID EXCISION     TONSILLECTOMY     Social History   Socioeconomic History   Marital status: Single    Spouse name: Not on file   Number of children: Not on file   Years of education: Not on file   Highest education level: Not on file  Occupational History   Occupation: Air traffic controller  Tobacco Use   Smoking status: Never   Smokeless tobacco: Never  Substance and Sexual Activity   Alcohol use: No   Drug use: No   Sexual activity: Not Currently    Birth control/protection: None  Other Topics Concern   Not on file  Social History Narrative   ** Merged History Encounter **       Social  Drivers of Corporate investment banker Strain: Not on file  Food Insecurity: Not on file  Transportation Needs: Not on file  Physical Activity: Not on file  Stress: Not on file  Social Connections: Not on file   Allergies  Allergen Reactions   Anaprox [Naproxen] Itching and Swelling   Asa [Aspirin] Swelling   Ibuprofen Itching and Swelling   Other Itching and Swelling    Darvocet-N    Sulindac Itching and Swelling   Family History  Problem Relation Age of Onset   Hypertension Father    Skin cancer Father    Hypertension Mother    Hypertension Sister    Diabetes Sister    Congestive Heart Failure Sister    CAD Sister    Breast cancer Maternal Aunt    Breast cancer Cousin        in 31's    Current Outpatient Medications (Endocrine & Metabolic):    norethindrone (AYGESTIN) 5 MG tablet, Take 1 tablet (5 mg total) by mouth 3 (three) times daily.   predniSONE (DELTASONE) 20 MG tablet, Take 2 tablets (40 mg total) by mouth daily with breakfast.  Current Outpatient Medications (Cardiovascular):    amLODipine (NORVASC) 5 MG tablet, Take 5 mg by mouth daily.    ezetimibe (ZETIA) 10 MG tablet, Take 10 mg by mouth daily.  hydrALAZINE (APRESOLINE) 10 MG tablet, Take 1 tablet (10 mg total) by mouth 4 (four) times daily.   triamterene-hydrochlorothiazide (MAXZIDE-25) 37.5-25 MG tablet, Take 1 tablet by mouth daily.   Current Outpatient Medications (Respiratory):    albuterol (VENTOLIN HFA) 108 (90 Base) MCG/ACT inhaler, Inhale 1-2 puffs into the lungs every 6 (six) hours as needed for wheezing or shortness of breath.    cetirizine (ZYRTEC) 10 MG tablet, Take 10 mg by mouth daily.   triamcinolone (NASACORT) 55 MCG/ACT AERO nasal inhaler, Place 2 sprays into the nose daily.   chlorpheniramine (CHLOR-TRIMETON) 4 MG tablet, Take 1 tablet (4 mg total) by mouth in the morning, at noon, and at bedtime.    Current Outpatient Medications (Other):    cyclobenzaprine (FLEXERIL) 10 MG  tablet, cyclobenzaprine 10 mg tablet   Diclofenac Sodium 2 % SOLN, Place 2 g onto the skin 2 (two) times daily.   esomeprazole (NEXIUM) 40 MG capsule, Take 1 capsule (40 mg total) by mouth daily at 12 noon.   MULTIPLE VITAMIN PO, Take by mouth.   omeprazole (PRILOSEC) 40 MG capsule, Take 40 mg by mouth 2 (two) times daily.   PRESCRIPTION MEDICATION,    tiZANidine (ZANAFLEX) 2 MG tablet, Take 1 tablet (2 mg total) by mouth at bedtime.   Vitamin D, Ergocalciferol, (DRISDOL) 1.25 MG (50000 UT) CAPS capsule, Take 1 capsule (50,000 Units total) by mouth every 7 (seven) days.   Reviewed prior external information including notes and imaging from  primary care provider As well as notes that were available from care everywhere and other healthcare systems.  Past medical history, social, surgical and family history all reviewed in electronic medical record.  No pertanent information unless stated regarding to the chief complaint.   Review of Systems:  No headache, visual changes, nausea, vomiting, diarrhea, constipation, dizziness, abdominal pain, skin rash, fevers, chills, night sweats, weight loss, swollen lymph nodes, body aches, joint swelling, chest pain, shortness of breath, mood changes. POSITIVE muscle aches  Objective  Blood pressure 118/78, pulse 71, height 5\' 9"  (1.753 m), weight 187 lb (84.8 kg), SpO2 98%.   General: No apparent distress alert and oriented x3 mood and affect normal, dressed appropriately.  HEENT: Pupils equal, extraocular movements intact  Respiratory: Patient's speak in full sentences and does not appear short of breath  Cardiovascular: No lower extremity edema, non tender, no erythema  Patient's right shoulder still has positive impingement noted.  Rotator cuff strength though does appear to be somewhat better.  Limited range of motion secondary to Left wrist does have a mass noted.  This seems to be on the dorsal aspect.  No erythema of the skin noted.  Maybe lacks  the last 5 degrees of extension.  Limited muscular skeletal ultrasound was performed and interpreted by Antoine Primas, M   Limited ultrasound shows regarding patient's shoulder some hypoechoic changes consistent with more of a subacromial bursitis.  Acromioclavicular joint appears to be relatively unremarkable.  Regarding the wrist did not appears to have a hypoechoic change that is consistent with more of a ganglion cyst.  Does seem to be intra-articular.    Impression and Recommendations:     The above documentation has been reviewed and is accurate and complete Judi Saa, DO

## 2023-06-02 ENCOUNTER — Ambulatory Visit: Payer: 59 | Admitting: Family Medicine

## 2023-06-02 ENCOUNTER — Other Ambulatory Visit: Payer: Self-pay

## 2023-06-02 ENCOUNTER — Encounter: Payer: Self-pay | Admitting: Family Medicine

## 2023-06-02 VITALS — BP 118/78 | HR 71 | Ht 69.0 in | Wt 187.0 lb

## 2023-06-02 DIAGNOSIS — M25411 Effusion, right shoulder: Secondary | ICD-10-CM | POA: Diagnosis not present

## 2023-06-02 DIAGNOSIS — M25511 Pain in right shoulder: Secondary | ICD-10-CM | POA: Diagnosis not present

## 2023-06-02 DIAGNOSIS — M67432 Ganglion, left wrist: Secondary | ICD-10-CM | POA: Diagnosis not present

## 2023-06-02 DIAGNOSIS — M7551 Bursitis of right shoulder: Secondary | ICD-10-CM | POA: Diagnosis not present

## 2023-06-02 MED ORDER — TIZANIDINE HCL 2 MG PO TABS: 2.0000 mg | ORAL_TABLET | Freq: Every day | ORAL | 0 refills | Status: AC

## 2023-06-02 NOTE — Patient Instructions (Addendum)
Zanaflex 2mg : If needed you can double if not helping initially DO NOT take with cyclobenzaprine See you again in 2 months

## 2023-06-02 NOTE — Assessment & Plan Note (Addendum)
Discussed with patient I did see some hypoechoic changes again.  Discussed icing regimen.  Discussed increasing activities.  Patient will follow-up with me again in 2 months.  If worsening I would highly recommend the injection.  Could consider the possibility of an MRI.  Patient is following up with primary care as well for some abnormal blood values. Muscle relaxer prescribed, Zanaflex 2 mg to take at night.  Discontinue the cyclobenzaprine secondary to side effects

## 2023-06-02 NOTE — Assessment & Plan Note (Signed)
Improved from previous exam

## 2023-06-02 NOTE — Assessment & Plan Note (Signed)
Ganglion cyst noted on ultrasound today.  If it does get in the way of more increasing discomfort or stopping her from daily activities would recommend aspiration.  Patient wants to see how it does with that only starting to hurt her regularly recently.

## 2023-06-06 ENCOUNTER — Other Ambulatory Visit: Payer: Self-pay | Admitting: Obstetrics & Gynecology

## 2023-06-06 DIAGNOSIS — Z0000 Encounter for general adult medical examination without abnormal findings: Secondary | ICD-10-CM

## 2023-06-28 ENCOUNTER — Ambulatory Visit
Admission: RE | Admit: 2023-06-28 | Discharge: 2023-06-28 | Disposition: A | Discharge status: Home / Self Care | Payer: 59 | Source: Ambulatory Visit | Attending: Obstetrics & Gynecology | Admitting: Obstetrics & Gynecology

## 2023-06-28 DIAGNOSIS — Z0000 Encounter for general adult medical examination without abnormal findings: Secondary | ICD-10-CM

## 2023-07-28 ENCOUNTER — Ambulatory Visit: Payer: 59 | Admitting: Family Medicine

## 2023-09-02 ENCOUNTER — Ambulatory Visit: Admitting: Family Medicine

## 2023-10-20 NOTE — Progress Notes (Unsigned)
 [facility] [address] [place] [address] Phone: [phone] Subjective:    I'm seeing this patient by the request  of:  Victorio Grave, MD  CC:   [id number]  06/02/2023 Discussed with patient I did see some hypoechoic changes again.  Discussed icing regimen.  Discussed increasing activities.  Patient will follow-up with me again in 2 months.  If worsening I would highly recommend the injection.  Could consider the possibility of an MRI.  Patient is following up with primary care as well for some abnormal blood values. Muscle relaxer prescribed, Zanaflex  2 mg to take at night.  Discontinue the cyclobenzaprine secondary to side effects      Update 10/26/2023 Daisy Sawyer is a 58 y.o. female coming in with complaint of R shoulder pain. Patient states        Past Medical History:  Diagnosis Date   Asthma    Endometriosis    Environmental allergies    Hypercholesteremia    Past Surgical History:  Procedure Laterality Date   KELOID EXCISION     TONSILLECTOMY     Social History   Socioeconomic History   Marital status: Single    Spouse name: Not on file   Number of children: Not on file   Years of education: Not on file   Highest education level: Not on file  Occupational History   Occupation: Air traffic controller  Tobacco Use   Smoking status: Never   Smokeless tobacco: Never  Substance and Sexual Activity   Alcohol use: No   Drug use: No   Sexual activity: Not Currently    Birth control/protection: None  Other Topics Concern   Not on file  Social History Narrative   ** Merged History Encounter **       Social Drivers of Corporate investment banker Strain: Not on file  Food Insecurity: Not on file  Transportation Needs: Not on file  Physical Activity: Not on file  Stress: Not on file  Social Connections: Not on file   Allergies  Allergen Reactions   Anaprox [Naproxen] Itching and Swelling   Asa [Aspirin]  Swelling   Ibuprofen Itching and Swelling   Other Itching and Swelling    Darvocet-N    Sulindac Itching and Swelling   Family History  Problem Relation Age of Onset   Hypertension Father    Skin cancer Father    Hypertension Mother    Hypertension Sister    Diabetes Sister    Congestive Heart Failure Sister    CAD Sister    Breast cancer Maternal Aunt    Breast cancer Cousin        in 10's    Current Outpatient Medications (Endocrine & Metabolic):    norethindrone  (AYGESTIN ) 5 MG tablet, Take 1 tablet (5 mg total) by mouth 3 (three) times daily.   predniSONE  (DELTASONE ) 20 MG tablet, Take 2 tablets (40 mg total) by mouth daily with breakfast.  Current Outpatient Medications (Cardiovascular):    amLODipine (NORVASC) 5 MG tablet, Take 5 mg by mouth daily.    ezetimibe (ZETIA) 10 MG tablet, Take 10 mg by mouth daily.   hydrALAZINE  (APRESOLINE ) 10 MG tablet, Take 1 tablet (10 mg total) by mouth 4 (four) times daily.   triamterene-hydrochlorothiazide (MAXZIDE-25) 37.5-25 MG tablet, Take 1 tablet by mouth daily.   Current Outpatient Medications (Respiratory):    albuterol (VENTOLIN HFA) 108 (90 Base) MCG/ACT inhaler, Inhale 1-2 puffs into the lungs every 6 (  six) hours as needed for wheezing or shortness of breath.    cetirizine (ZYRTEC) 10 MG tablet, Take 10 mg by mouth daily.   chlorpheniramine  (CHLOR-TRIMETON ) 4 MG tablet, Take 1 tablet (4 mg total) by mouth in the morning, at noon, and at bedtime.   triamcinolone  (NASACORT) 55 MCG/ACT AERO nasal inhaler, Place 2 sprays into the nose daily.    Current Outpatient Medications (Other):    cyclobenzaprine (FLEXERIL) 10 MG tablet, cyclobenzaprine 10 mg tablet   Diclofenac  Sodium 2 % SOLN, Place 2 g onto the skin 2 (two) times daily.   esomeprazole  (NEXIUM ) 40 MG capsule, Take 1 capsule (40 mg total) by mouth daily at 12 noon.   MULTIPLE VITAMIN PO, Take by mouth.   omeprazole (PRILOSEC) 40 MG capsule, Take 40 mg by mouth 2 (two)  times daily.   PRESCRIPTION MEDICATION,    tiZANidine  (ZANAFLEX ) 2 MG tablet, Take 1 tablet (2 mg total) by mouth at bedtime.   Vitamin D , Ergocalciferol , (DRISDOL ) 1.25 MG (50000 UT) CAPS capsule, Take 1 capsule (50,000 Units total) by mouth every 7 (seven) days.   Reviewed prior external information including notes and imaging from  primary care provider As well as notes that were available from care everywhere and other healthcare systems.  Past medical history, social, surgical and family history all reviewed in electronic medical record.  No pertanent information unless stated regarding to the chief complaint.   Review of Systems:  No headache, visual changes, nausea, vomiting, diarrhea, constipation, dizziness, abdominal pain, skin rash, fevers, chills, night sweats, weight loss, swollen lymph nodes, body aches, joint swelling, chest pain, shortness of breath, mood changes. POSITIVE muscle aches  Objective  There were no vitals taken for this visit.   General: No apparent distress alert and oriented x3 mood and affect normal, dressed appropriately.  HEENT: Pupils equal, extraocular movements intact  Respiratory: Patient's speak in full sentences and does not appear short of breath  Cardiovascular: No lower extremity edema, non tender, no erythema      Impression and Recommendations:

## 2023-10-26 ENCOUNTER — Encounter: Payer: Self-pay | Admitting: Family Medicine

## 2023-10-26 ENCOUNTER — Ambulatory Visit: Admitting: Family Medicine

## 2023-10-26 ENCOUNTER — Ambulatory Visit: Payer: Self-pay | Admitting: Family Medicine

## 2023-10-26 VITALS — BP 130/82 | HR 64 | Ht 69.0 in | Wt 183.0 lb

## 2023-10-26 DIAGNOSIS — M2550 Pain in unspecified joint: Secondary | ICD-10-CM | POA: Diagnosis not present

## 2023-10-26 LAB — CBC WITH DIFFERENTIAL/PLATELET
Basophils Absolute: 0.1 10*3/uL (ref 0.0–0.1)
Basophils Relative: 1.1 % (ref 0.0–3.0)
Eosinophils Absolute: 0.2 10*3/uL (ref 0.0–0.7)
Eosinophils Relative: 3.6 % (ref 0.0–5.0)
HCT: 44.2 % (ref 36.0–46.0)
Hemoglobin: 14.6 g/dL (ref 12.0–15.0)
Lymphocytes Relative: 25.9 % (ref 12.0–46.0)
Lymphs Abs: 1.7 10*3/uL (ref 0.7–4.0)
MCHC: 33.1 g/dL (ref 30.0–36.0)
MCV: 88.2 fl (ref 78.0–100.0)
Monocytes Absolute: 0.5 10*3/uL (ref 0.1–1.0)
Monocytes Relative: 7.1 % (ref 3.0–12.0)
Neutro Abs: 4.2 10*3/uL (ref 1.4–7.7)
Neutrophils Relative %: 62.3 % (ref 43.0–77.0)
Platelets: 359 10*3/uL (ref 150.0–400.0)
RBC: 5.01 Mil/uL (ref 3.87–5.11)
RDW: 14.6 % (ref 11.5–15.5)
WBC: 6.7 10*3/uL (ref 4.0–10.5)

## 2023-10-26 LAB — COMPREHENSIVE METABOLIC PANEL WITH GFR
ALT: 45 U/L — ABNORMAL HIGH (ref 0–35)
AST: 33 U/L (ref 0–37)
Albumin: 4.7 g/dL (ref 3.5–5.2)
Alkaline Phosphatase: 120 U/L — ABNORMAL HIGH (ref 39–117)
BUN: 13 mg/dL (ref 6–23)
CO2: 23 meq/L (ref 19–32)
Calcium: 10.7 mg/dL — ABNORMAL HIGH (ref 8.4–10.5)
Chloride: 105 meq/L (ref 96–112)
Creatinine, Ser: 0.99 mg/dL (ref 0.40–1.20)
GFR: 63.02 mL/min (ref >60.00)
Glucose, Bld: 84 mg/dL (ref 70–99)
Potassium: 3.4 meq/L — ABNORMAL LOW (ref 3.5–5.1)
Sodium: 143 meq/L (ref 135–145)
Total Bilirubin: 0.6 mg/dL (ref 0.2–1.2)
Total Protein: 7.4 g/dL (ref 6.0–8.3)

## 2023-10-26 LAB — LUTEINIZING HORMONE: LH: 28.05 m[IU]/mL

## 2023-10-26 LAB — IBC PANEL
Iron: 139 ug/dL (ref 42–145)
Saturation Ratios: 39.7 % (ref 20.0–50.0)
TIBC: 350 ug/dL (ref 250.0–450.0)
Transferrin: 250 mg/dL (ref 212.0–360.0)

## 2023-10-26 LAB — FOLLICLE STIMULATING HORMONE: FSH: 28.5 m[IU]/mL

## 2023-10-26 LAB — FERRITIN: Ferritin: 196.7 ng/mL (ref 10.0–291.0)

## 2023-10-26 LAB — URIC ACID: Uric Acid, Serum: 5.6 mg/dL (ref 2.4–7.0)

## 2023-10-26 LAB — C-REACTIVE PROTEIN: CRP: <1 mg/dL (ref 0.5–20.0)

## 2023-10-26 LAB — SEDIMENTATION RATE: Sed Rate: 39 mm/h — ABNORMAL HIGH (ref 0–30)

## 2023-10-26 LAB — TSH: TSH: 1.27 u[IU]/mL (ref 0.35–5.50)

## 2023-10-26 LAB — VITAMIN D 25 HYDROXY (VIT D DEFICIENCY, FRACTURES): VITD: 101.28 ng/mL (ref 30.00–100.00)

## 2023-10-26 LAB — VITAMIN B12: Vitamin B-12: >1500 pg/mL — ABNORMAL HIGH (ref 211–911)

## 2023-10-26 NOTE — Assessment & Plan Note (Signed)
 Patient is having significant tenderness and pain in multiple different areas.  Discussed icing regimen and home exercises, which activities to do in which ones to avoid.  Increase activity slowly.  Discussed icing regimen and home exercises.  Follow-up again in 6 to 8 weeks.

## 2023-10-26 NOTE — Patient Instructions (Signed)
 Labs on the way out  We will reach out once labs come back

## 2023-10-29 LAB — ANA: Anti Nuclear Antibody (ANA): NEGATIVE

## 2023-10-29 LAB — RHEUMATOID FACTOR: Rheumatoid fact SerPl-aCnc: <10 [IU]/mL (ref <14)

## 2023-10-29 LAB — CALCIUM, IONIZED: Calcium, Ion: 5.7 mg/dL — ABNORMAL HIGH (ref 4.7–5.5)

## 2023-10-29 LAB — ANGIOTENSIN CONVERTING ENZYME: Angiotensin-Converting Enzyme: 52 U/L (ref 9–67)

## 2023-10-29 LAB — PTH, INTACT AND CALCIUM
Calcium: 10.2 mg/dL (ref 8.6–10.4)
PTH: 14 pg/mL — ABNORMAL LOW (ref 16–77)

## 2023-10-29 LAB — CYCLIC CITRUL PEPTIDE ANTIBODY, IGG: Cyclic Citrullin Peptide Ab: <16 U

## 2023-11-03 ENCOUNTER — Other Ambulatory Visit: Payer: Self-pay | Admitting: Family Medicine

## 2023-11-03 MED ORDER — PREDNISONE 20 MG PO TABS: 40.0000 mg | ORAL_TABLET | Freq: Every day | ORAL | 0 refills | Status: AC

## 2023-11-09 ENCOUNTER — Telehealth: Payer: Self-pay | Admitting: Family Medicine

## 2023-11-09 NOTE — Telephone Encounter (Signed)
 Left message for patient

## 2023-11-09 NOTE — Telephone Encounter (Signed)
 Pt called, last seen 10/26/2023. Unsure if all labs have been reviewed from that visit. Pt pain is unrelenting, completed prednisone  with no relief. Pt considering going to the ED but unsure what to do about her pain, has been in bed for past two days.

## 2023-11-30 ENCOUNTER — Other Ambulatory Visit: Payer: Self-pay | Admitting: Rheumatology

## 2023-11-30 DIAGNOSIS — M461 Sacroiliitis, not elsewhere classified: Secondary | ICD-10-CM

## 2023-12-20 ENCOUNTER — Other Ambulatory Visit

## 2023-12-20 ENCOUNTER — Other Ambulatory Visit: Payer: Self-pay | Admitting: Rheumatology

## 2023-12-20 ENCOUNTER — Encounter: Payer: Self-pay | Admitting: Rheumatology

## 2023-12-20 DIAGNOSIS — M545 Low back pain, unspecified: Secondary | ICD-10-CM

## 2023-12-20 DIAGNOSIS — M461 Sacroiliitis, not elsewhere classified: Secondary | ICD-10-CM

## 2023-12-22 ENCOUNTER — Other Ambulatory Visit

## 2023-12-30 ENCOUNTER — Ambulatory Visit
Admission: RE | Admit: 2023-12-30 | Discharge: 2023-12-30 | Disposition: A | Discharge status: Home / Self Care | Source: Ambulatory Visit | Attending: Rheumatology | Admitting: Rheumatology

## 2023-12-30 DIAGNOSIS — M461 Sacroiliitis, not elsewhere classified: Secondary | ICD-10-CM
